# Patient Record
Sex: Male | Born: 1980 | Race: White | Hispanic: No | Marital: Married | Smoking: Former smoker
Health system: Southern US, Community
[De-identification: ages and names within clinical notes are randomized; demographics above are authoritative.]

## PROBLEM LIST (undated history)

## (undated) DIAGNOSIS — K8020 Calculus of gallbladder without cholecystitis without obstruction: Secondary | ICD-10-CM

---

## 2012-11-09 ENCOUNTER — Other Ambulatory Visit (INDEPENDENT_AMBULATORY_CARE_PROVIDER_SITE_OTHER): Payer: BC Managed Care – HMO

## 2012-11-09 DIAGNOSIS — Z111 Encounter for screening for respiratory tuberculosis: Secondary | ICD-10-CM

## 2012-11-11 LAB — TB SKIN TEST
Induration: 0 mm
TB Skin Test: NEGATIVE

## 2012-11-22 ENCOUNTER — Emergency Department (HOSPITAL_COMMUNITY): Payer: BC Managed Care – PPO

## 2012-11-22 ENCOUNTER — Encounter (HOSPITAL_COMMUNITY)
Admission: EM | Disposition: A | Discharge status: Home / Self Care | Payer: Self-pay | Source: Home / Self Care | Attending: Emergency Medicine

## 2012-11-22 ENCOUNTER — Observation Stay (HOSPITAL_COMMUNITY): Payer: BC Managed Care – PPO | Admitting: Certified Registered"

## 2012-11-22 ENCOUNTER — Encounter (HOSPITAL_COMMUNITY): Payer: Self-pay | Admitting: Certified Registered"

## 2012-11-22 ENCOUNTER — Encounter (HOSPITAL_COMMUNITY): Payer: Self-pay | Admitting: *Deleted

## 2012-11-22 ENCOUNTER — Observation Stay (HOSPITAL_COMMUNITY)
Admission: EM | Admit: 2012-11-22 | Discharge: 2012-11-23 | Disposition: A | Discharge status: Home / Self Care | Payer: BC Managed Care – PPO | Attending: General Surgery | Admitting: General Surgery

## 2012-11-22 DIAGNOSIS — K8020 Calculus of gallbladder without cholecystitis without obstruction: Secondary | ICD-10-CM

## 2012-11-22 DIAGNOSIS — K8000 Calculus of gallbladder with acute cholecystitis without obstruction: Principal | ICD-10-CM | POA: Insufficient documentation

## 2012-11-22 DIAGNOSIS — R1011 Right upper quadrant pain: Secondary | ICD-10-CM | POA: Insufficient documentation

## 2012-11-22 DIAGNOSIS — E876 Hypokalemia: Secondary | ICD-10-CM | POA: Diagnosis present

## 2012-11-22 DIAGNOSIS — K8010 Calculus of gallbladder with chronic cholecystitis without obstruction: Secondary | ICD-10-CM

## 2012-11-22 DIAGNOSIS — K8050 Calculus of bile duct without cholangitis or cholecystitis without obstruction: Secondary | ICD-10-CM | POA: Diagnosis present

## 2012-11-22 DIAGNOSIS — R7309 Other abnormal glucose: Secondary | ICD-10-CM | POA: Insufficient documentation

## 2012-11-22 DIAGNOSIS — R739 Hyperglycemia, unspecified: Secondary | ICD-10-CM | POA: Diagnosis present

## 2012-11-22 HISTORY — PX: CHOLECYSTECTOMY: SHX55

## 2012-11-22 HISTORY — DX: Calculus of gallbladder without cholecystitis without obstruction: K80.20

## 2012-11-22 LAB — COMPREHENSIVE METABOLIC PANEL
Alkaline Phosphatase: 80 U/L (ref 39–117)
BUN: 14 mg/dL (ref 6–23)
CO2: 24 mEq/L (ref 19–32)
Chloride: 102 mEq/L (ref 96–112)
GFR calc Af Amer: >90 mL/min (ref >90)
Glucose, Bld: 185 mg/dL — ABNORMAL HIGH (ref 70–99)
Potassium: 3.2 mEq/L — ABNORMAL LOW (ref 3.5–5.1)
Total Bilirubin: 0.6 mg/dL (ref 0.3–1.2)

## 2012-11-22 LAB — URINALYSIS, ROUTINE W REFLEX MICROSCOPIC
Bilirubin Urine: NEGATIVE
Ketones, ur: 40 mg/dL — AB
Nitrite: NEGATIVE
Urobilinogen, UA: 0.2 mg/dL (ref 0.0–1.0)
pH: 6 (ref 5.0–8.0)

## 2012-11-22 LAB — HEMOGLOBIN A1C: Mean Plasma Glucose: 91 mg/dL (ref <117)

## 2012-11-22 LAB — CBC WITH DIFFERENTIAL/PLATELET
Hemoglobin: 16.2 g/dL (ref 13.0–17.0)
Lymphs Abs: 2.4 10*3/uL (ref 0.7–4.0)
Monocytes Relative: 10 % (ref 3–12)
Neutro Abs: 12.4 10*3/uL — ABNORMAL HIGH (ref 1.7–7.7)
Neutrophils Relative %: 74 % (ref 43–77)
RBC: 4.96 MIL/uL (ref 4.22–5.81)

## 2012-11-22 LAB — LIPASE, BLOOD: Lipase: 18 U/L (ref 11–59)

## 2012-11-22 SURGERY — LAPAROSCOPIC CHOLECYSTECTOMY
Anesthesia: General | Site: Abdomen | Wound class: Clean Contaminated

## 2012-11-22 MED ORDER — POTASSIUM CHLORIDE IN NACL 20-0.9 MEQ/L-% IV SOLN
INTRAVENOUS | Status: DC
  Administered 2012-11-22 – 2012-11-23 (×2): via INTRAVENOUS
  Filled 2012-11-22 (×4): qty 1000

## 2012-11-22 MED ORDER — POTASSIUM CHLORIDE IN NACL 20-0.9 MEQ/L-% IV SOLN
INTRAVENOUS | Status: DC
  Filled 2012-11-22 (×3): qty 1000

## 2012-11-22 MED ORDER — MIDAZOLAM HCL 5 MG/5ML IJ SOLN
INTRAMUSCULAR | Status: DC | PRN
  Administered 2012-11-22: 2 mg via INTRAVENOUS

## 2012-11-22 MED ORDER — LIDOCAINE HCL (CARDIAC) 20 MG/ML IV SOLN
INTRAVENOUS | Status: DC | PRN
  Administered 2012-11-22: 100 mg via INTRAVENOUS

## 2012-11-22 MED ORDER — MORPHINE SULFATE 2 MG/ML IJ SOLN
1.0000 mg | INTRAMUSCULAR | Status: DC | PRN
  Administered 2012-11-22 (×3): 2 mg via INTRAVENOUS
  Filled 2012-11-22 (×3): qty 1

## 2012-11-22 MED ORDER — BUPIVACAINE-EPINEPHRINE 0.25% -1:200000 IJ SOLN
INTRAMUSCULAR | Status: DC | PRN
  Administered 2012-11-22 (×2): 30 mL

## 2012-11-22 MED ORDER — DEXAMETHASONE SODIUM PHOSPHATE 4 MG/ML IJ SOLN
INTRAMUSCULAR | Status: DC | PRN
  Administered 2012-11-22: 8 mg via INTRAVENOUS

## 2012-11-22 MED ORDER — NEOSTIGMINE METHYLSULFATE 1 MG/ML IJ SOLN
INTRAMUSCULAR | Status: DC | PRN
  Administered 2012-11-22: 3 mg via INTRAVENOUS

## 2012-11-22 MED ORDER — SODIUM CHLORIDE 0.9 % IJ SOLN
INTRAMUSCULAR | Status: DC | PRN
  Administered 2012-11-22: 14:00:00

## 2012-11-22 MED ORDER — DIPHENHYDRAMINE HCL 50 MG/ML IJ SOLN: 12.5000 mg | Freq: Four times a day (QID) | INTRAMUSCULAR | Status: DC | PRN

## 2012-11-22 MED ORDER — LACTATED RINGERS IV SOLN
INTRAVENOUS | Status: DC | PRN
  Administered 2012-11-22 (×2): via INTRAVENOUS

## 2012-11-22 MED ORDER — DIPHENHYDRAMINE HCL 12.5 MG/5ML PO ELIX: 12.5000 mg | ORAL_SOLUTION | Freq: Four times a day (QID) | ORAL | Status: DC | PRN

## 2012-11-22 MED ORDER — OXYCODONE HCL 5 MG/5ML PO SOLN: 5.0000 mg | Freq: Once | ORAL | Status: AC | PRN

## 2012-11-22 MED ORDER — LACTATED RINGERS IV SOLN
INTRAVENOUS | Status: DC
  Administered 2012-11-22: 13:00:00 via INTRAVENOUS

## 2012-11-22 MED ORDER — HYDROMORPHONE HCL PF 1 MG/ML IJ SOLN
0.2500 mg | INTRAMUSCULAR | Status: DC | PRN
  Administered 2012-11-22 (×3): 0.5 mg via INTRAVENOUS

## 2012-11-22 MED ORDER — ROCURONIUM BROMIDE 100 MG/10ML IV SOLN
INTRAVENOUS | Status: DC | PRN
  Administered 2012-11-22: 10 mg via INTRAVENOUS
  Administered 2012-11-22: 40 mg via INTRAVENOUS

## 2012-11-22 MED ORDER — CIPROFLOXACIN IN D5W 400 MG/200ML IV SOLN
400.0000 mg | Freq: Two times a day (BID) | INTRAVENOUS | Status: DC
  Administered 2012-11-22: 400 mg via INTRAVENOUS
  Filled 2012-11-22 (×2): qty 200

## 2012-11-22 MED ORDER — FENTANYL CITRATE 0.05 MG/ML IJ SOLN
INTRAMUSCULAR | Status: DC | PRN
  Administered 2012-11-22: 100 ug via INTRAVENOUS
  Administered 2012-11-22: 50 ug via INTRAVENOUS
  Administered 2012-11-22: 100 ug via INTRAVENOUS

## 2012-11-22 MED ORDER — HYDROMORPHONE HCL PF 1 MG/ML IJ SOLN
INTRAMUSCULAR | Status: DC | PRN
  Administered 2012-11-22: 1 mg via INTRAVENOUS

## 2012-11-22 MED ORDER — HYDROMORPHONE HCL PF 1 MG/ML IJ SOLN
1.0000 mg | Freq: Once | INTRAMUSCULAR | Status: AC
  Administered 2012-11-22: 1 mg via INTRAVENOUS
  Filled 2012-11-22: qty 1

## 2012-11-22 MED ORDER — OXYCODONE-ACETAMINOPHEN 5-325 MG PO TABS
1.0000 | ORAL_TABLET | ORAL | Status: DC | PRN
  Administered 2012-11-22 – 2012-11-23 (×3): 2 via ORAL
  Filled 2012-11-22 (×3): qty 2

## 2012-11-22 MED ORDER — ONDANSETRON HCL 4 MG/2ML IJ SOLN: 4.0000 mg | Freq: Four times a day (QID) | INTRAMUSCULAR | Status: DC | PRN

## 2012-11-22 MED ORDER — ONDANSETRON HCL 4 MG/2ML IJ SOLN
INTRAMUSCULAR | Status: DC | PRN
  Administered 2012-11-22: 4 mg via INTRAVENOUS

## 2012-11-22 MED ORDER — PROPOFOL 10 MG/ML IV BOLUS
INTRAVENOUS | Status: DC | PRN
  Administered 2012-11-22: 200 mg via INTRAVENOUS

## 2012-11-22 MED ORDER — 0.9 % SODIUM CHLORIDE (POUR BTL) OPTIME
TOPICAL | Status: DC | PRN
  Administered 2012-11-22: 1000 mL

## 2012-11-22 MED ORDER — ONDANSETRON HCL 4 MG/2ML IJ SOLN
4.0000 mg | Freq: Once | INTRAMUSCULAR | Status: AC
  Administered 2012-11-22: 4 mg via INTRAVENOUS
  Filled 2012-11-22: qty 2

## 2012-11-22 MED ORDER — GLYCOPYRROLATE 0.2 MG/ML IJ SOLN
INTRAMUSCULAR | Status: DC | PRN
  Administered 2012-11-22: 0.4 mg via INTRAVENOUS

## 2012-11-22 MED ORDER — SUCCINYLCHOLINE CHLORIDE 20 MG/ML IJ SOLN
INTRAMUSCULAR | Status: DC | PRN
  Administered 2012-11-22: 100 mg via INTRAVENOUS

## 2012-11-22 MED ORDER — SODIUM CHLORIDE 0.9 % IR SOLN
Status: DC | PRN
  Administered 2012-11-22: 1000 mL

## 2012-11-22 MED ORDER — OXYCODONE HCL 5 MG PO TABS
5.0000 mg | ORAL_TABLET | Freq: Once | ORAL | Status: AC | PRN
Start: 2012-11-22 — End: 2012-11-22

## 2012-11-22 MED ORDER — CIPROFLOXACIN IN D5W 400 MG/200ML IV SOLN
400.0000 mg | Freq: Two times a day (BID) | INTRAVENOUS | Status: AC
  Administered 2012-11-22 – 2012-11-23 (×2): 400 mg via INTRAVENOUS
  Filled 2012-11-22 (×2): qty 200

## 2012-11-22 SURGICAL SUPPLY — 36 items
APPLIER CLIP 5 13 M/L LIGAMAX5 (MISCELLANEOUS) ×3
BLADE SURG ROTATE 9660 (MISCELLANEOUS) IMPLANT
CANISTER SUCTION 2500CC (MISCELLANEOUS) ×3 IMPLANT
CHLORAPREP W/TINT 26ML (MISCELLANEOUS) ×3 IMPLANT
CLIP APPLIE 5 13 M/L LIGAMAX5 (MISCELLANEOUS) ×2 IMPLANT
CLOTH BEACON ORANGE TIMEOUT ST (SAFETY) ×3 IMPLANT
COVER MAYO STAND STRL (DRAPES) ×3 IMPLANT
COVER SURGICAL LIGHT HANDLE (MISCELLANEOUS) ×3 IMPLANT
DECANTER SPIKE VIAL GLASS SM (MISCELLANEOUS) ×6 IMPLANT
DERMABOND ADHESIVE PROPEN (GAUZE/BANDAGES/DRESSINGS) ×1
DERMABOND ADVANCED (GAUZE/BANDAGES/DRESSINGS) ×1
DERMABOND ADVANCED .7 DNX12 (GAUZE/BANDAGES/DRESSINGS) ×2 IMPLANT
DERMABOND ADVANCED .7 DNX6 (GAUZE/BANDAGES/DRESSINGS) ×2 IMPLANT
DRAPE C-ARM 42X72 X-RAY (DRAPES) ×3 IMPLANT
ELECT REM PT RETURN 9FT ADLT (ELECTROSURGICAL) ×3
ELECTRODE REM PT RTRN 9FT ADLT (ELECTROSURGICAL) ×2 IMPLANT
GLOVE BIO SURGEON STRL SZ7 (GLOVE) ×3 IMPLANT
GLOVE BIOGEL PI IND STRL 7.5 (GLOVE) ×2 IMPLANT
GLOVE BIOGEL PI INDICATOR 7.5 (GLOVE) ×1
GOWN STRL NON-REIN LRG LVL3 (GOWN DISPOSABLE) ×12 IMPLANT
KIT BASIN OR (CUSTOM PROCEDURE TRAY) ×3 IMPLANT
KIT ROOM TURNOVER OR (KITS) ×3 IMPLANT
NS IRRIG 1000ML POUR BTL (IV SOLUTION) ×3 IMPLANT
PAD ARMBOARD 7.5X6 YLW CONV (MISCELLANEOUS) ×3 IMPLANT
POUCH SPECIMEN RETRIEVAL 10MM (ENDOMECHANICALS) ×3 IMPLANT
SCISSORS LAP 5X35 DISP (ENDOMECHANICALS) IMPLANT
SET CHOLANGIOGRAPH 5 50 .035 (SET/KITS/TRAYS/PACK) IMPLANT
SET IRRIG TUBING LAPAROSCOPIC (IRRIGATION / IRRIGATOR) ×3 IMPLANT
SLEEVE ENDOPATH XCEL 5M (ENDOMECHANICALS) ×6 IMPLANT
SPECIMEN JAR SMALL (MISCELLANEOUS) ×3 IMPLANT
SUT MNCRL AB 4-0 PS2 18 (SUTURE) ×3 IMPLANT
TOWEL OR 17X24 6PK STRL BLUE (TOWEL DISPOSABLE) ×3 IMPLANT
TOWEL OR 17X26 10 PK STRL BLUE (TOWEL DISPOSABLE) ×3 IMPLANT
TRAY LAPAROSCOPIC (CUSTOM PROCEDURE TRAY) ×3 IMPLANT
TROCAR XCEL BLUNT TIP 100MML (ENDOMECHANICALS) ×3 IMPLANT
TROCAR XCEL NON-BLD 5MMX100MML (ENDOMECHANICALS) ×3 IMPLANT

## 2012-11-22 NOTE — Transfer of Care (Signed)
Immediate Anesthesia Transfer of Care Note  Patient: Kenneth Munoz  Procedure(s) Performed: Procedure(s): LAPAROSCOPIC CHOLECYSTECTOMY  Patient Location: PACU  Anesthesia Type:General  Level of Consciousness: awake, alert , oriented and patient cooperative  Airway & Oxygen Therapy: Patient Spontanous Breathing and Patient connected to nasal cannula oxygen  Post-op Assessment: Report given to PACU RN, Post -op Vital signs reviewed and stable and Patient moving all extremities  Post vital signs: Reviewed and stable  Complications: No apparent anesthesia complications

## 2012-11-22 NOTE — ED Notes (Signed)
The pt is c/i epigastric  Pain  With nv .  The pain goes through to his posterior chest.  He has known gallstones and this feels, like the same

## 2012-11-22 NOTE — Op Note (Addendum)
Preoperative diagnosis: Acute cholecystitis Postoperative diagnosis: Same as above Procedure: Laparoscopic cholecystectomy Surgeon: Dr Harden Mo Asst: Dr Gaynelle Adu Anesthesia: Gen. Drains: None Specimens: Gallbladder and contents to pathology Estimated blood loss: Minimal Complications: None Sponge needle count correct at end of operation Disposition to recovery stable  Indications: This a 32 year old male with a known history of gallstones and prior symptoms who presents with recent onset of right upper quadrant and epigastric pain that is not going away. His liver function tests and lipase are normal. His white blood cell count is elevated. He has another ultrasound consistent with cholelithiasis. We discussed proceeding with a laparoscopic cholecystectomy.  Procedure: After informed consent was obtained the patient was taken to the operating room. He was given ciprofloxacin due to his penicillin allergy. Sequential compression devices were on his legs. He was placed under general anesthesia without complication. His abdomen was prepped and draped in the standard sterile surgical fashion. Surgical timeout was performed.  I infiltrated Marcaine below his umbilicus. I made a vertical incision. I entered his fascia sharply and the peritoneum bluntly. I then placed a 0 Vicryl pursestring suture. I placed a Hassan trocar insufflated the abdomen to 15 mm mercury pressure. 3 further 5 mm trocars were then placed in the epigastrium and right side of the abdomen under direct vision without complication. His gallbladder was then noted to be distended. This was decompressed. The gallbladder was then retracted cephalad and lateral. With some dissection the critical view was eventually obtained. There were 2 branches of the artery and the posterior branch had a mild amount of bleeding associated with it. I clipped the duct and divided it. I then clipped the anterior branch of the artery and divided it.  I placed clips on the posterior branch and this made his hemostatic. The clip on the gallbladder did fall off and a couple of small stones that fell out I evacuated. The gallbladder was then removed from the liver bed. It was then placed in an Endo Catch bag and removed from the umbilicus. Irrigation was performed. This was clear. Hemostasis was observed. I did not see any remaining stones. I then removed the umbilical trocar and tied my pursestring. This completely liberated obliterated the defect. I then removed my remaining trocars. I closed these with 4 Monocryl and Dermabond. He tolerated this well was extubated and transferred to recovery stable.

## 2012-11-22 NOTE — ED Notes (Signed)
Pt has come to pod c to await surgical evaluation

## 2012-11-22 NOTE — ED Provider Notes (Signed)
History    CSN: [id number] Arrival date & time 11/22/12  0250  First MD Initiated Contact with Patient 11/22/12 0257     Chief Complaint  Patient presents with  . Abdominal Pain   (Consider location/radiation/quality/duration/timing/severity/associated sxs/prior Treatment) HPI Comments: Patient presents to ER for evaluation of abdominal pain. Patient reports severe pain in the center of his upper abdomen which radiates into his back. Patient reports that he had a similar episode about a year ago and was was his gallbladder. He never had followup for surgery. This is the first time he has had the pain again since the initial attack.  Patient is a 32 y.o. male presenting with abdominal pain.  Abdominal Pain Associated symptoms include abdominal pain.   Past Medical History  Diagnosis Date  . Gallstones    History reviewed. No pertinent past surgical history. No family history on file. History  Substance Use Topics  . Smoking status: Current Every Day Smoker  . Smokeless tobacco: Not on file  . Alcohol Use: Yes    Review of Systems  Gastrointestinal: Positive for abdominal pain.  Musculoskeletal: Positive for back pain.  All other systems reviewed and are negative.    Allergies  Review of patient's allergies indicates not on file.  Home Medications  No current outpatient prescriptions on file. BP 118/71  Pulse 80  Resp 20  SpO2 99% Physical Exam  Constitutional: He is oriented to person, place, and time. He appears well-developed and well-nourished. No distress.  HENT:  Head: Normocephalic and atraumatic.  Right Ear: Hearing normal.  Left Ear: Hearing normal.  Nose: Nose normal.  Mouth/Throat: Oropharynx is clear and moist and mucous membranes are normal.  Eyes: Conjunctivae and EOM are normal. Pupils are equal, round, and reactive to light.  Neck: Normal range of motion. Neck supple.  Cardiovascular: Regular rhythm, S1 normal and S2 normal.  Exam reveals no  gallop and no friction rub.   No murmur heard. Pulmonary/Chest: Effort normal and breath sounds normal. No respiratory distress. He exhibits no tenderness.  Abdominal: Soft. Normal appearance and bowel sounds are normal. There is no hepatosplenomegaly. There is tenderness in the epigastric area. There is no rebound, no guarding, no tenderness at McBurney's point and negative Murphy's sign. No hernia.  Musculoskeletal: Normal range of motion.  Neurological: He is alert and oriented to person, place, and time. He has normal strength. No cranial nerve deficit or sensory deficit. Coordination normal. GCS eye subscore is 4. GCS verbal subscore is 5. GCS motor subscore is 6.  Skin: Skin is warm, dry and intact. No rash noted. No cyanosis.  Psychiatric: He has a normal mood and affect. His speech is normal and behavior is normal. Thought content normal.    ED Course  Procedures (including critical care time) Labs Reviewed  CBC WITH DIFFERENTIAL - Abnormal; Notable for the following:    WBC 16.9 (*)    MCHC 37.0 (*)    Neutro Abs 12.4 (*)    Monocytes Absolute 1.7 (*)    All other components within normal limits  COMPREHENSIVE METABOLIC PANEL - Abnormal; Notable for the following:    Potassium 3.2 (*)    Glucose, Bld 185 (*)    All other components within normal limits  URINALYSIS, ROUTINE W REFLEX MICROSCOPIC - Abnormal; Notable for the following:    Color, Urine AMBER (*)    Glucose, UA 250 (*)    Ketones, ur 40 (*)    All other components within normal limits  LIPASE, BLOOD   US Abdomen Complete  11/22/2012   *RADIOLOGY REPORT*  Clinical Data:  Nominal pain and elevated white cell count. History of gallstones.  COMPLETE ABDOMINAL ULTRASOUND  Comparison:  None.  Findings:  Gallbladder:  Cholelithiasis with multiple stones in the dependent gallbladder.  Murphy's sign is negative.  No gallbladder wall thickening or pericholecystic edema.  Common bile duct:  Normal caliber with measured diameter  is 6.5 mm.  Liver:  No focal lesion identified.  Within normal limits in parenchymal echogenicity. Limited color flow Doppler imaging demonstrates appropriate flow direction in the main portal vein.  IVC:  Appears normal.  Pancreas:  Not visualized due to overlying bowel gas.  Spleen:  Spleen length measures 12.9 cm.  Normal parenchymal echotexture.  Right Kidney:  Right kidney measures 11.2 cm length.  No hydronephrosis.  Left Kidney:  Left kidney measures 12.2 cm length.  No hydronephrosis.  Abdominal aorta:  No aneurysm identified.  IMPRESSION: Cholelithiasis.  No specific signs of cholecystitis.   Original Report Authenticated By: Burman Nieves, M.D.   Diagnosis: 1. Biliary colic 2. Cholelithiasis without cholecystitis  MDM  Patient presents to the ER for evaluation of upper abdominal pain. Patient is in severe pain at arrival to the ER. He reports that he has been told that he has "gallbladder problems" in the past. He wasn't sure if it was gallstones. Patient has not had any problems with this type of pain since the first episode. Patient had small improvement with a dose of Dilaudid. Ultrasound showed gallstones without evidence of cholecystitis. Patient had a repeat dose of Dilaudid with further improvement, but is still not pain-free. General surgery therefore consulted. Will see patient in ER.  Gilda Crease, MD 11/22/12 [phone]

## 2012-11-22 NOTE — H&P (Signed)
Agree with above, I discussed the procedure in detail.  We discussed the risks and benefits of a laparoscopic cholecystectomy and possible cholangiogram including, but not limited to bleeding, infection, injury to surrounding structures such as the intestine or liver, bile leak, retained gallstones, need to convert to an open procedure, prolonged diarrhea, blood clots such as  DVT, common bile duct injury, anesthesia risks, and possible need for additional procedures.  The likelihood of improvement in symptoms and return to the patient's normal status is good. We discussed the typical post-operative recovery course.  

## 2012-11-22 NOTE — H&P (Signed)
Kenneth Munoz 1980/12/20  [id number].   Primary Care MD: Dr. Almedia Balls Chief Complaint/Reason for Consult: gallstones HPI: This is a pleasant 32 yo male who no PMH who was diagnosed with gallstones a year and a half ago after a 2 hour attack of epigastric abdominal pain.  He deferred surgical follow up as he was having no other symptoms.  However, last night he awoke at 1200am with severe epigastric abdominal pain radiating to his back.  He had nausea and multiple episodes of emesis.  Denies diarrhea.  No fevers or chills. He presented to [facility] where he had an [place] that revealed gallstones, but no wall thickening or pericholecystic fluid.  His WBC was 17K and LFTs were normal.  We have been asked to see for surgical evaluation. (of note his CBG is in the 180s and urine glucose in the mid 200s)  Review of Systems: Please see HPI, otherwise all other systems are negative.  History reviewed. No pertinent family history.  Past Medical History  Diagnosis Date  . Gallstones     History reviewed. No pertinent past surgical history.  Social History:  reports that he has been smoking.  He does not have any smokeless tobacco history on file. He reports that  drinks alcohol. His drug history is not on file.  Allergies:  Allergies  Allergen Reactions  . Penicillins     Childhood allergy reaction unknown     (Not in a hospital admission)  Blood pressure 119/67, pulse 63, resp. rate 20, SpO2 96.00%. Physical Exam: General: pleasant, WD, WN white male who is laying in bed in NAD HEENT: head is normocephalic, atraumatic.  Sclera are noninjected.  PERRL.  Ears and nose without any masses or lesions.  Mouth is pink and moist Heart: regular, rate, and rhythm.  Normal s1,s2. No obvious murmurs, gallops, or rubs noted.  Palpable radial and pedal pulses bilaterally Lungs: CTAB, no wheezes, rhonchi, or rales noted.  Respiratory effort nonlabored Abd: soft, tender in the epigastrium and RUQ, ND, +BS, no  masses, hernias, or organomegaly MS: all 4 extremities are symmetrical with no cyanosis, clubbing, or edema. Skin: warm and dry with no masses, lesions, or rashes Psych: A&Ox3 with an appropriate affect.    Results for orders placed during the hospital encounter of 11/22/12 (from the past 48 hour(s))  CBC WITH DIFFERENTIAL     Status: Abnormal   Collection Time    11/22/12  3:40 AM      Result Value Range   WBC 16.9 (*) 4.0 - 10.5 K/uL   RBC 4.96  4.22 - 5.81 MIL/uL   Hemoglobin 16.2  13.0 - 17.0 g/dL   HCT 40.9  81.1 - 91.4 %   MCV 88.3  78.0 - 100.0 fL   MCH 32.7  26.0 - 34.0 pg   MCHC 37.0 (*) 30.0 - 36.0 g/dL   RDW 78.2  95.6 - 21.3 %   Platelets 234  150 - 400 K/uL   Neutrophils Relative % 74  43 - 77 %   Neutro Abs 12.4 (*) 1.7 - 7.7 K/uL   Lymphocytes Relative 14  12 - 46 %   Lymphs Abs 2.4  0.7 - 4.0 K/uL   Monocytes Relative 10  3 - 12 %   Monocytes Absolute 1.7 (*) 0.1 - 1.0 K/uL   Eosinophils Relative 2  0 - 5 %   Eosinophils Absolute 0.4  0.0 - 0.7 K/uL   Basophils Relative 0  0 - 1 %  Basophils Absolute 0.1  0.0 - 0.1 K/uL  COMPREHENSIVE METABOLIC PANEL     Status: Abnormal   Collection Time    11/22/12  3:40 AM      Result Value Range   Sodium 138  135 - 145 mEq/L   Potassium 3.2 (*) 3.5 - 5.1 mEq/L   Chloride 102  96 - 112 mEq/L   CO2 24  19 - 32 mEq/L   Glucose, Bld 185 (*) 70 - 99 mg/dL   BUN 14  6 - 23 mg/dL   Creatinine, Ser 1.61  0.50 - 1.35 mg/dL   Calcium 9.1  8.4 - 09.6 mg/dL   Total Protein 7.1  6.0 - 8.3 g/dL   Albumin 4.2  3.5 - 5.2 g/dL   AST 16  0 - 37 U/L   ALT 16  0 - 53 U/L   Alkaline Phosphatase 80  39 - 117 U/L   Total Bilirubin 0.6  0.3 - 1.2 mg/dL   GFR calc non Af Amer >90  >90 mL/min   GFR calc Af Amer >90  >90 mL/min   Comment:            The eGFR has been calculated     using the CKD EPI equation.     This calculation has not been     validated in all clinical     situations.     eGFR's persistently     <90 mL/min  signify     possible Chronic Kidney Disease.  LIPASE, BLOOD     Status: None   Collection Time    11/22/12  3:40 AM      Result Value Range   Lipase 18  11 - 59 U/L  URINALYSIS, ROUTINE W REFLEX MICROSCOPIC     Status: Abnormal   Collection Time    11/22/12  4:55 AM      Result Value Range   Color, Urine AMBER (*) YELLOW   Comment: BIOCHEMICALS MAY BE AFFECTED BY COLOR   APPearance CLEAR  CLEAR   Specific Gravity, Urine 1.028  1.005 - 1.030   pH 6.0  5.0 - 8.0   Glucose, UA 250 (*) NEGATIVE mg/dL   Hgb urine dipstick NEGATIVE  NEGATIVE   Bilirubin Urine NEGATIVE  NEGATIVE   Ketones, ur 40 (*) NEGATIVE mg/dL   Protein, ur NEGATIVE  NEGATIVE mg/dL   Urobilinogen, UA 0.2  0.0 - 1.0 mg/dL   Nitrite NEGATIVE  NEGATIVE   Leukocytes, UA NEGATIVE  NEGATIVE   Comment: MICROSCOPIC NOT DONE ON URINES WITH NEGATIVE PROTEIN, BLOOD, LEUKOCYTES, NITRITE, OR GLUCOSE <1000 mg/dL.   US Abdomen Complete  11/22/2012   *RADIOLOGY REPORT*  Clinical Data:  Nominal pain and elevated white cell count. History of gallstones.  COMPLETE ABDOMINAL ULTRASOUND  Comparison:  None.  Findings:  Gallbladder:  Cholelithiasis with multiple stones in the dependent gallbladder.  Murphy's sign is negative.  No gallbladder wall thickening or pericholecystic edema.  Common bile duct:  Normal caliber with measured diameter is 6.5 mm.  Liver:  No focal lesion identified.  Within normal limits in parenchymal echogenicity. Limited color flow Doppler imaging demonstrates appropriate flow direction in the main portal vein.  IVC:  Appears normal.  Pancreas:  Not visualized due to overlying bowel gas.  Spleen:  Spleen length measures 12.9 cm.  Normal parenchymal echotexture.  Right Kidney:  Right kidney measures 11.2 cm length.  No hydronephrosis.  Left Kidney:  Left kidney measures 12.2 cm length.  No hydronephrosis.  Abdominal aorta:  No aneurysm identified.  IMPRESSION: Cholelithiasis.  No specific signs of cholecystitis.   Original  Report Authenticated By: Burman Nieves, M.D.       Assessment/Plan 1. Biliary Colic, possibly early cholecystitis 2. hyperglycemia 3. Hypokalemia  Plan: 1. We will admit this patient and plan for lap chole later today.  We will start him on Cipro since he has a PCN allergy.  The procedure along with risks, complications, and outcome have been discussed with he and his girlfriend.  They understand and are agreeable to proceed.  We will replace his K with his IVFs.  I will check a hgbA1C as well given the elevated CBG and glucose in his urine.  Kenneth Munoz E 11/22/2012, 8:41 AM Pager: [phone]

## 2012-11-22 NOTE — Preoperative (Signed)
Beta Blockers   Reason not to administer Beta Blockers:Not Applicable 

## 2012-11-22 NOTE — Anesthesia Postprocedure Evaluation (Signed)
Anesthesia Post Note  Patient: Kenneth Munoz  Procedure(s) Performed: Procedure(s): LAPAROSCOPIC CHOLECYSTECTOMY  Anesthesia type: General  Patient location: PACU  Post pain: Pain level controlled  Post assessment: Patient's Cardiovascular Status Stable  Last Vitals:  Filed Vitals:   11/22/12 1552  BP: 112/62  Pulse: 75  Temp: 36.7 C  Resp: 16    Post vital signs: Reviewed and stable  Level of consciousness: alert  Complications: No apparent anesthesia complications

## 2012-11-22 NOTE — ED Notes (Signed)
Surgical pa here to begin eval

## 2012-11-22 NOTE — ED Notes (Signed)
Patient transported to Ultrasound 

## 2012-11-22 NOTE — Anesthesia Preprocedure Evaluation (Signed)
Anesthesia Evaluation  Patient identified by MRN, date of birth, ID band Patient awake    Reviewed: Allergy & Precautions, H&P , NPO status , Patient's Chart, lab work & pertinent test results  Airway Mallampati: I TM Distance: >3 FB Neck ROM: Full    Dental no notable dental hx. (+) Teeth Intact and Dental Advisory Given   Pulmonary neg pulmonary ROS,  breath sounds clear to auscultation  Pulmonary exam normal       Cardiovascular negative cardio ROS  Rhythm:Regular Rate:Normal     Neuro/Psych negative neurological ROS  negative psych ROS   GI/Hepatic negative GI ROS, Neg liver ROS,   Endo/Other  negative endocrine ROS  Renal/GU negative Renal ROS  negative genitourinary   Musculoskeletal   Abdominal   Peds  Hematology negative hematology ROS (+)   Anesthesia Other Findings   Reproductive/Obstetrics negative OB ROS                           Anesthesia Physical Anesthesia Plan  ASA: I  Anesthesia Plan: General   Post-op Pain Management:    Induction: Intravenous  Airway Management Planned: Oral ETT  Additional Equipment:   Intra-op Plan:   Post-operative Plan: Extubation in OR  Informed Consent: I have reviewed the patients History and Physical, chart, labs and discussed the procedure including the risks, benefits and alternatives for the proposed anesthesia with the patient or authorized representative who has indicated his/her understanding and acceptance.   Dental advisory given  Plan Discussed with: CRNA  Anesthesia Plan Comments:         Anesthesia Quick Evaluation  

## 2012-11-22 NOTE — ED Notes (Signed)
To bathroom unable to void now.

## 2012-11-22 NOTE — ED Notes (Signed)
Report called to [place] on [address]

## 2012-11-23 ENCOUNTER — Encounter (HOSPITAL_COMMUNITY): Payer: Self-pay | Admitting: General Surgery

## 2012-11-23 MED ORDER — OXYCODONE-ACETAMINOPHEN 5-325 MG PO TABS: 1.0000 | ORAL_TABLET | ORAL | Status: DC | PRN

## 2012-11-23 NOTE — Progress Notes (Signed)
1 Day Post-Op  Subjective: Pt doing okay, pain under adequate control with oral medication.  He has been up.  Tolerated clear liquid diet last night.  No flatus or bm.  Voiding without any difficulties.  Objective: Vital signs in last 24 hours: Temp:  [98 F (36.7 C)-98.6 F (37 C)] 98.6 F (37 C) (07/08 0612) Pulse Rate:  [56-87] 56 (07/08 0612) Resp:  [13-34] 18 (07/08 0612) BP: (103-131)/(47-70) 115/60 mmHg (07/08 0612) SpO2:  [95 %-98 %] 98 % (07/08 0612)    Intake/Output from previous day: 07/07 0701 - 07/08 0700 In: 1890 [I.V.:1890] Out: 1115 [Urine:1100; Blood:15] Intake/Output this shift:    General appearance: alert, cooperative, appears stated age and no distress Resp: clear to auscultation bilaterally Cardio: regular rate and rhythm, S1, S2 normal, no murmur, click, rub or gallop GI: +BS x4 quadrants, mild tenderness only around the incision sites.  Incision sites are clean dry and intact without erythema or drainage.  No hernias, masses or organomegaly. Extremities: extremities normal, atraumatic, no cyanosis or edema  Lab Results:   Recent Labs  11/22/12 0340  WBC 16.9*  HGB 16.2  HCT 43.8  PLT 234   BMET  Recent Labs  11/22/12 0340  NA 138  K 3.2*  CL 102  CO2 24  GLUCOSE 185*  BUN 14  CREATININE 0.87  CALCIUM 9.1   Studies/Results: US Abdomen Complete  11/22/2012   *RADIOLOGY REPORT*  Clinical Data:  Nominal pain and elevated white cell count. History of gallstones.  COMPLETE ABDOMINAL ULTRASOUND  Comparison:  None.  Findings:  Gallbladder:  Cholelithiasis with multiple stones in the dependent gallbladder.  Murphy's sign is negative.  No gallbladder wall thickening or pericholecystic edema.  Common bile duct:  Normal caliber with measured diameter is 6.5 mm.  Liver:  No focal lesion identified.  Within normal limits in parenchymal echogenicity. Limited color flow Doppler imaging demonstrates appropriate flow direction in the main portal vein.  IVC:   Appears normal.  Pancreas:  Not visualized due to overlying bowel gas.  Spleen:  Spleen length measures 12.9 cm.  Normal parenchymal echotexture.  Right Kidney:  Right kidney measures 11.2 cm length.  No hydronephrosis.  Left Kidney:  Left kidney measures 12.2 cm length.  No hydronephrosis.  Abdominal aorta:  No aneurysm identified.  IMPRESSION: Cholelithiasis.  No specific signs of cholecystitis.   Original Report Authenticated By: Burman Nieves, M.D.    Anti-infectives: Anti-infectives   Start     Dose/Rate Route Frequency Ordered Stop   11/22/12 2100  ciprofloxacin (CIPRO) IVPB 400 mg     400 mg 200 mL/hr over 60 Minutes Intravenous Every 12 hours 11/22/12 1606 11/23/12 2059   11/22/12 0915  ciprofloxacin (CIPRO) IVPB 400 mg  Status:  Discontinued     400 mg 200 mL/hr over 60 Minutes Intravenous Every 12 hours 11/22/12 0910 11/22/12 1606      Assessment/Plan: Acute Cholecystitis -s/p Lap Chole (Dr. Dwain Sarna 11/22/12) -advance to regular diet -obtain serum potassium(last 3.2 pt not supplemented) -ambulate -pain adequately controlled with oral medication. -may be discharged after breakfast and pending labwork.   LOS: 1 day    Ashok Norris ANP-BC Pager [phone]  11/23/2012 8:04 AM

## 2012-11-23 NOTE — Discharge Summary (Signed)
Physician Discharge Summary  Patient ID: Kenneth Munoz MRN: [id number] DOB/AGE: 1980/06/21 32 y.o.  Admit date: 11/22/2012 Discharge date: 11/23/2012  Admitting Diagnosis: Acute cholecystitis  Hypokalemia  Discharge Diagnosis Patient Active Problem List   Diagnosis Date Noted  . Biliary colic 11/22/2012  . Hyperglycemia 11/22/2012  . Hypokalemia 11/22/2012    Consultants none  Imaging: US Abdomen Complete  11/22/2012   *RADIOLOGY REPORT*  Clinical Data:  Nominal pain and elevated white cell count. History of gallstones.  COMPLETE ABDOMINAL ULTRASOUND  Comparison:  None.  Findings:  Gallbladder:  Cholelithiasis with multiple stones in the dependent gallbladder.  Murphy's sign is negative.  No gallbladder wall thickening or pericholecystic edema.  Common bile duct:  Normal caliber with measured diameter is 6.5 mm.  Liver:  No focal lesion identified.  Within normal limits in parenchymal echogenicity. Limited color flow Doppler imaging demonstrates appropriate flow direction in the main portal vein.  IVC:  Appears normal.  Pancreas:  Not visualized due to overlying bowel gas.  Spleen:  Spleen length measures 12.9 cm.  Normal parenchymal echotexture.  Right Kidney:  Right kidney measures 11.2 cm length.  No hydronephrosis.  Left Kidney:  Left kidney measures 12.2 cm length.  No hydronephrosis.  Abdominal aorta:  No aneurysm identified.  IMPRESSION: Cholelithiasis.  No specific signs of cholecystitis.   Original Report Authenticated By: Burman Nieves, M.D.    Procedures Laparoscopic cholecystectomy  Hospital Course:  Kenneth Munoz is a healthy 32 year old male who presented to [facility] with abdominal pain.  Workup showed early acute cholecystitis.  Patient was admitted and underwent procedure listed above.  Tolerated procedure well and was transferred to the floor.  Diet was advanced as tolerated.  On POD #1 the patient was voiding well, tolerating diet, ambulating well, pain well controlled,  vital signs stable, incisions c/d/i and felt stable for discharge home. He had an A1C which was 4.8% due to hyperglycemia, likely due to acute illness.  He also had hypokalemia, he was asymptomatic and when rechecked it was normal.  Patient will follow up in our office in 2 weeks and knows to call with questions or concerns.    Medication List         oxyCODONE-acetaminophen 5-325 MG per tablet  Commonly known as:  PERCOCET/ROXICET  Take 1-2 tablets by mouth every 4 (four) hours as needed.             Follow-up Information   Follow up with [facility] On 12/14/2012. (APPOINTMENT TIME: 10:45.  PLEASE ARRIVE BEFORE YOUR APPOINTMENT TIME)    Contact information:   [address]   [place] [address] [phone]       Signed: Ashok Norris, [facility] [phone]  11/23/2012, 12:31 PM

## 2012-11-23 NOTE — Progress Notes (Signed)
Agree with above, another glucose was nl, will recheck this am, I suspect this may be due to illness but should follow up with pcp, home if does well this am

## 2012-12-14 ENCOUNTER — Encounter (INDEPENDENT_AMBULATORY_CARE_PROVIDER_SITE_OTHER): Payer: Self-pay | Admitting: Internal Medicine

## 2012-12-14 ENCOUNTER — Ambulatory Visit (INDEPENDENT_AMBULATORY_CARE_PROVIDER_SITE_OTHER): Payer: BC Managed Care – PPO | Admitting: Internal Medicine

## 2012-12-14 VITALS — BP 112/76 | HR 74 | Temp 98.4°F | Resp 16 | Ht 70.0 in | Wt 163.8 lb

## 2012-12-14 DIAGNOSIS — K810 Acute cholecystitis: Secondary | ICD-10-CM

## 2012-12-14 NOTE — Progress Notes (Signed)
  Subjective: Pt returns to the clinic today after undergoing laparoscopic cholecystectomy on 11/22/12 by Dr. Dwain Sarna.  The patient is tolerating their diet well and is having no severe pain.  Bowel function is good.  No problems with the wounds.  Objective: Vital signs in last 24 hours: Reviewed  PE: Abd: soft, non-tender, +bs, incisions well healed  Lab Results:  No results found for this basename: WBC, HGB, HCT, PLT,  in the last 72 hours BMET No results found for this basename: NA, K, CL, CO2, GLUCOSE, BUN, CREATININE, CALCIUM,  in the last 72 hours PT/INR No results found for this basename: LABPROT, INR,  in the last 72 hours CMP     Component Value Date/Time   NA 138 11/22/2012 0340   K 4.4 11/23/2012 0805   CL 102 11/22/2012 0340   CO2 24 11/22/2012 0340   GLUCOSE 185* 11/22/2012 0340   BUN 14 11/22/2012 0340   CREATININE 0.87 11/22/2012 0340   CALCIUM 9.1 11/22/2012 0340   PROT 7.1 11/22/2012 0340   ALBUMIN 4.2 11/22/2012 0340   AST 16 11/22/2012 0340   ALT 16 11/22/2012 0340   ALKPHOS 80 11/22/2012 0340   BILITOT 0.6 11/22/2012 0340   GFRNONAA >90 11/22/2012 0340   GFRAA >90 11/22/2012 0340   Lipase     Component Value Date/Time   LIPASE 18 11/22/2012 0340       Studies/Results: No results found.  Anti-infectives: Anti-infectives   None       Assessment/Plan  1.  S/P Laparoscopic Cholecystectomy: doing well, may resume regular activity without restrictions, Pt will follow up with [place] [facility] and knows to call with questions or concerns.     Dayna Alia 12/14/2012

## 2012-12-14 NOTE — Patient Instructions (Signed)
May resume regular activity without restrictions. Follow up as needed. Call with questions or concerns.  

## 2015-02-15 IMAGING — US US ABDOMEN COMPLETE
1 series · 14 of 25 positions shown · non-contrast
Comparison: None.

CLINICAL DATA: Nominal pain and elevated white cell count.
History of gallstones.

COMPLETE ABDOMINAL ULTRASOUND

[Series 1: us abdomen complete · 0.25mm/px · 14 of 82 slices shown]
[im 1/82]
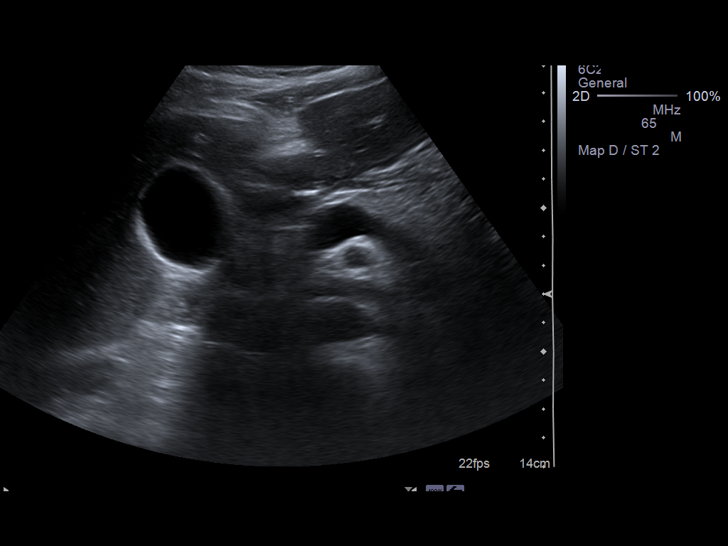
[im 7/82]
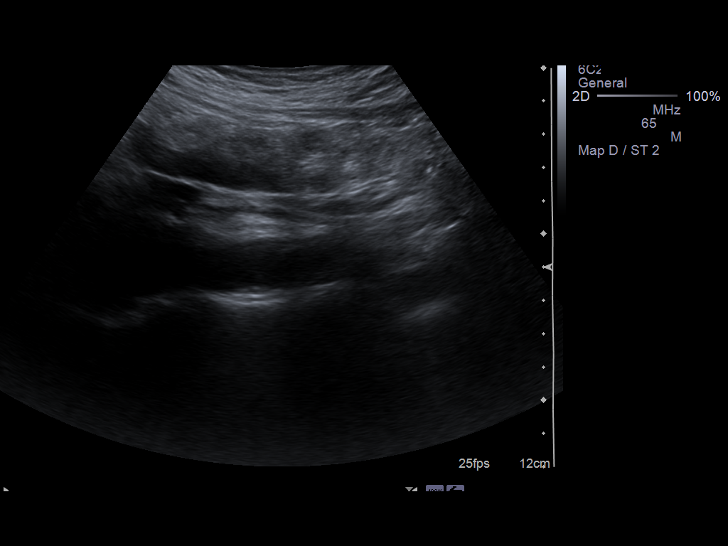
[im 14/82]
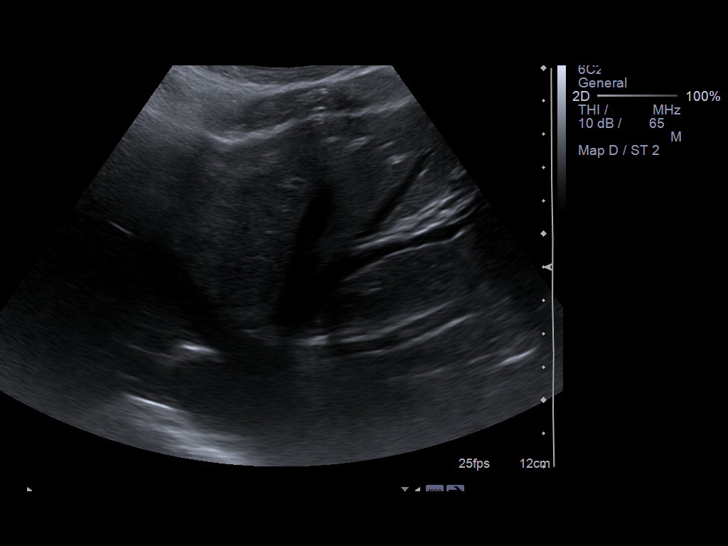
[im 21/82]
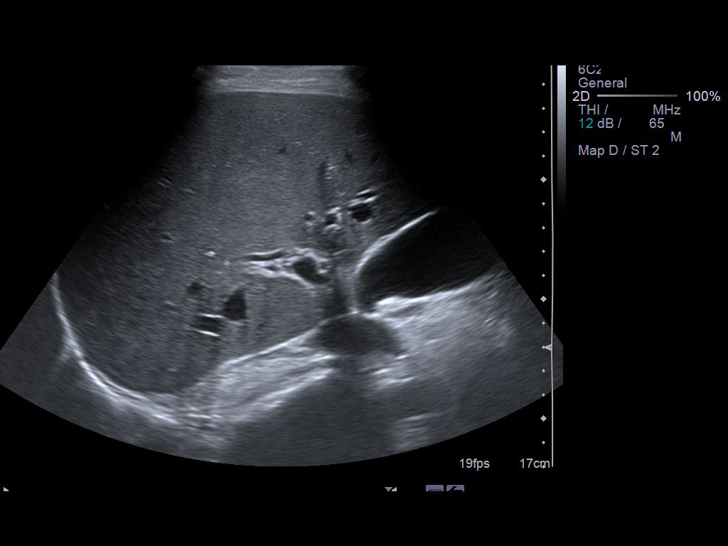
[im 28/82]
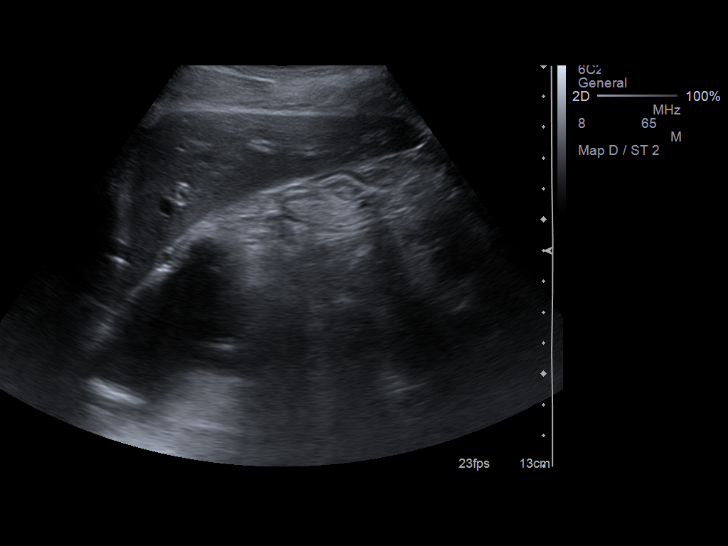
[im 31/82]
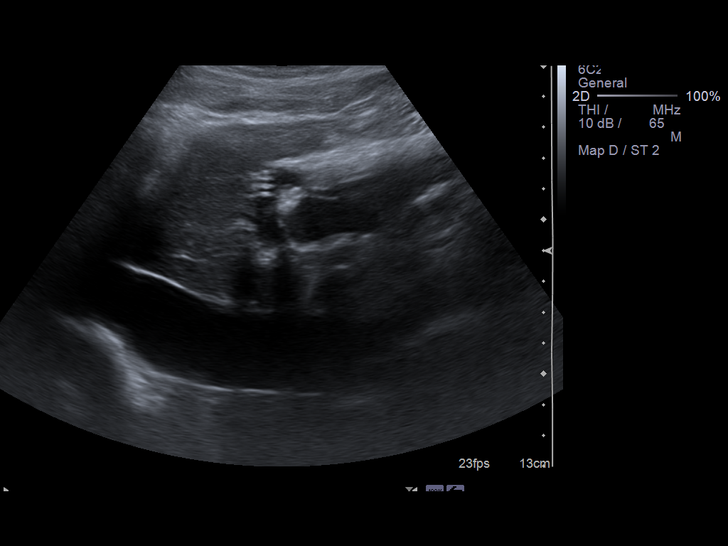
[im 38/82]
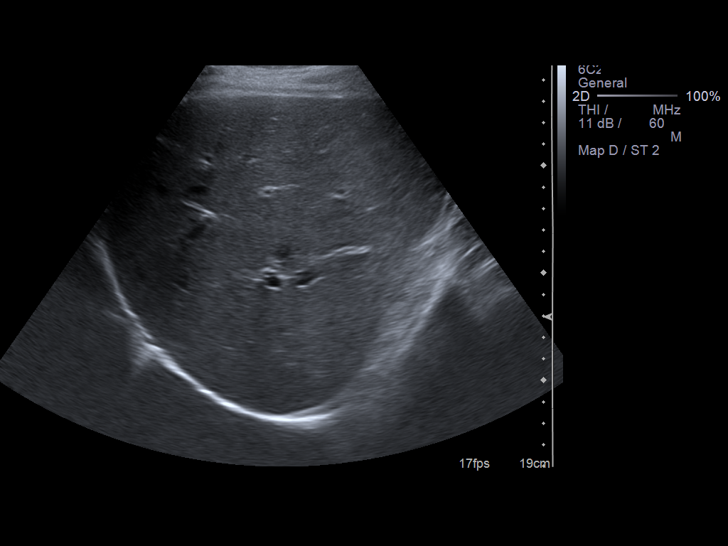
[im 44/82]
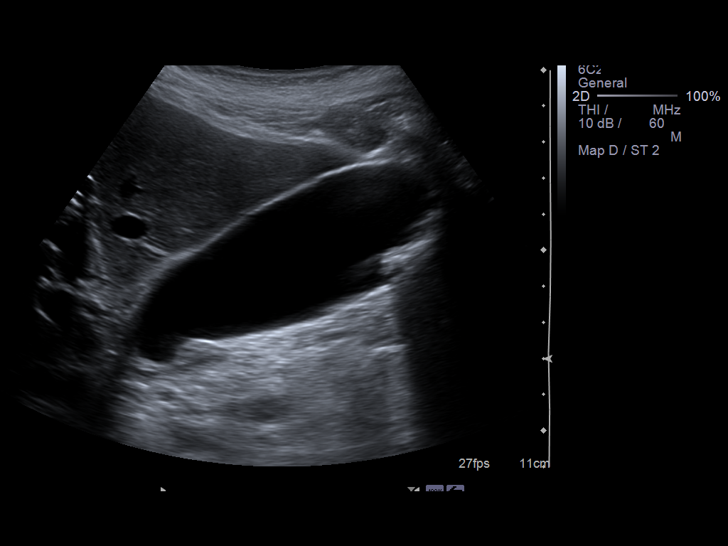
[im 51/82]
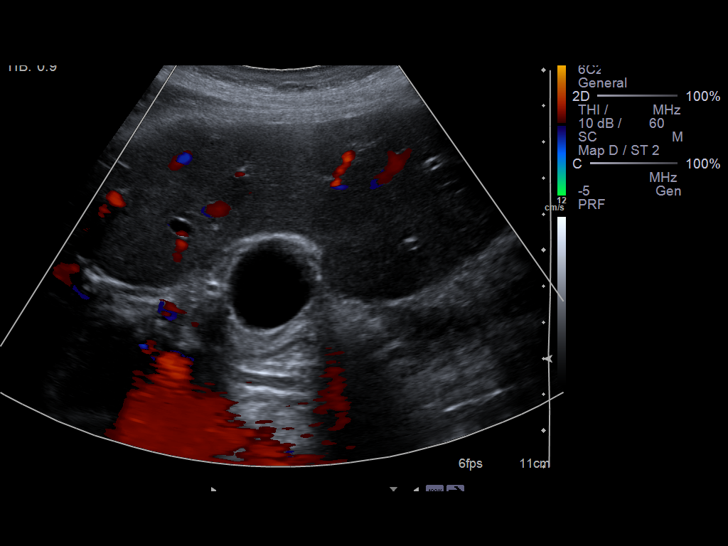
[im 55/82]
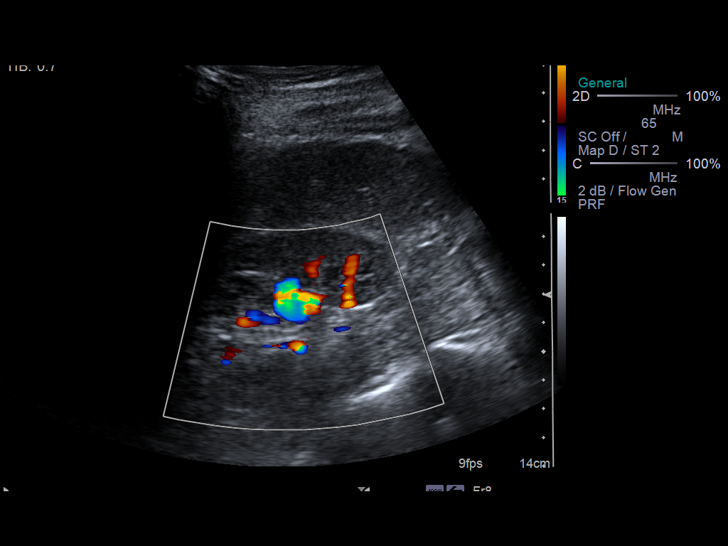
[im 61/82]
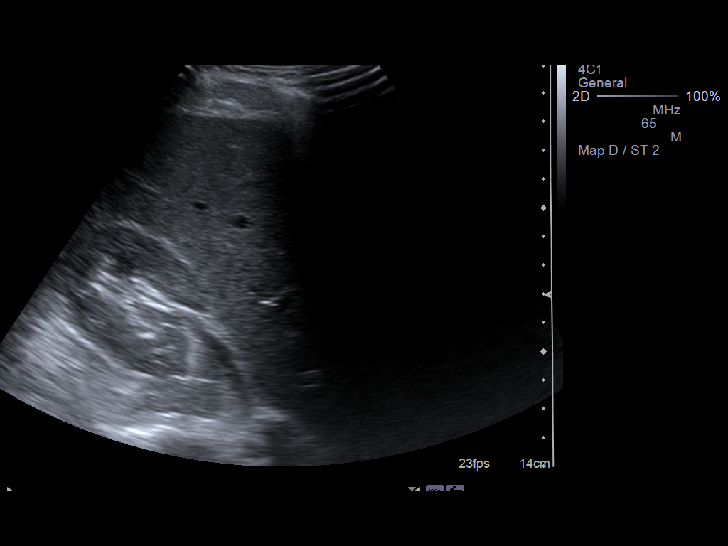
[im 68/82]
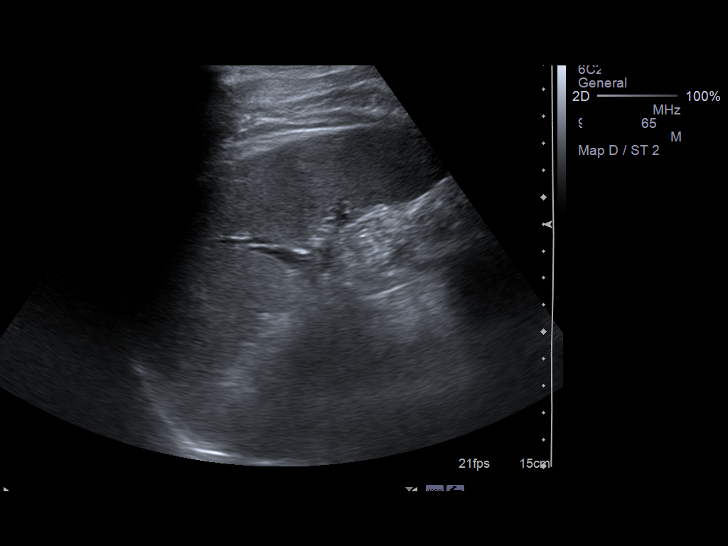
[im 75/82]
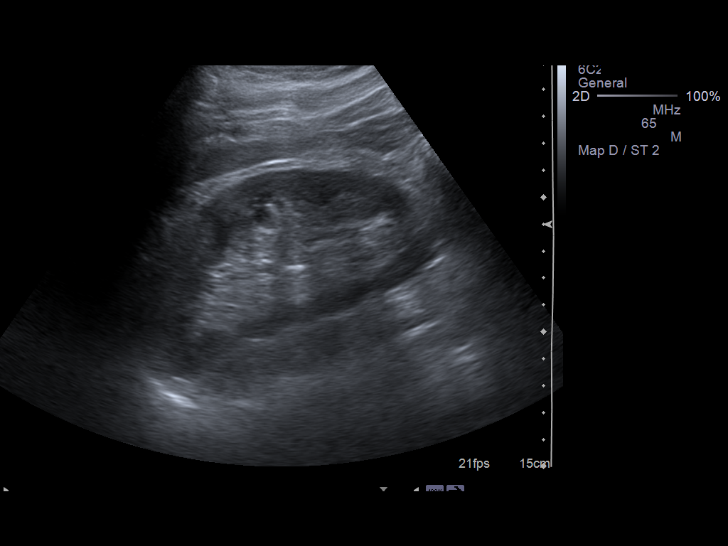
[im 82/82]
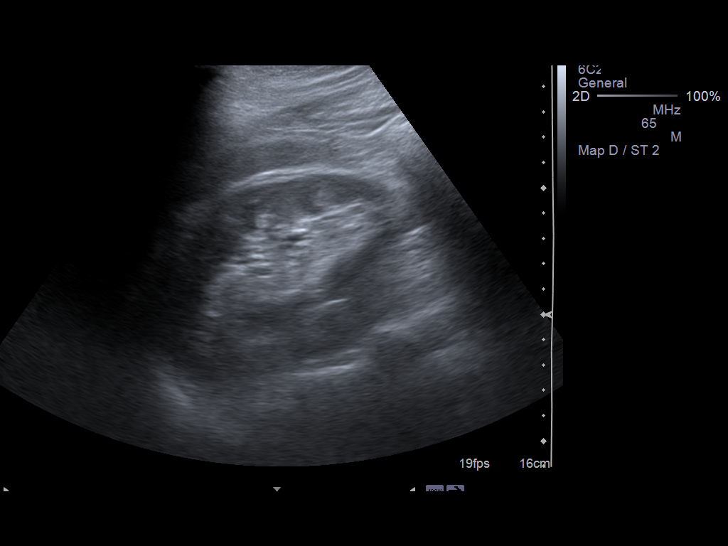

[14 of 25 positions shown; findings below may reference images not displayed]

FINDINGS: Gallbladder:  Cholelithiasis with multiple stones in the dependent
gallbladder.  Murphy's sign is negative.  No gallbladder wall
thickening or pericholecystic edema.

Common bile duct:  Normal caliber with measured diameter is 6.5 mm.

Liver:  No focal lesion identified.  Within normal limits in
parenchymal echogenicity. Limited color flow Doppler imaging
demonstrates appropriate flow direction in the main portal vein.

IVC:  Appears normal.

Pancreas:  Not visualized due to overlying bowel gas.

Spleen:  Spleen length measures 12.9 cm.  Normal parenchymal
echotexture.

Right Kidney:  Right kidney measures 11.2 cm length.  No
hydronephrosis.

Left Kidney:  Left kidney measures 12.2 cm length.  No
hydronephrosis.

Abdominal aorta:  No aneurysm identified.
IMPRESSION: Cholelithiasis.  No specific signs of cholecystitis.

## 2019-05-19 ENCOUNTER — Other Ambulatory Visit: Payer: Self-pay

## 2019-05-19 ENCOUNTER — Ambulatory Visit: Payer: 59 | Attending: Internal Medicine

## 2019-05-19 DIAGNOSIS — Z20822 Contact with and (suspected) exposure to covid-19: Secondary | ICD-10-CM

## 2019-05-19 DIAGNOSIS — Z20828 Contact with and (suspected) exposure to other viral communicable diseases: Secondary | ICD-10-CM | POA: Insufficient documentation

## 2019-05-21 LAB — NOVEL CORONAVIRUS, NAA: SARS-CoV-2, NAA: NOT DETECTED

## 2020-05-17 ENCOUNTER — Encounter: Payer: Self-pay | Admitting: *Deleted

## 2020-05-25 ENCOUNTER — Other Ambulatory Visit: Payer: Self-pay

## 2020-07-24 ENCOUNTER — Ambulatory Visit: Payer: Self-pay | Admitting: Family Medicine

## 2020-10-23 ENCOUNTER — Ambulatory Visit (HOSPITAL_BASED_OUTPATIENT_CLINIC_OR_DEPARTMENT_OTHER): Payer: 59 | Admitting: Family Medicine

## 2020-12-17 ENCOUNTER — Ambulatory Visit (HOSPITAL_BASED_OUTPATIENT_CLINIC_OR_DEPARTMENT_OTHER): Payer: 59 | Admitting: Family Medicine

## 2021-01-29 ENCOUNTER — Ambulatory Visit (HOSPITAL_BASED_OUTPATIENT_CLINIC_OR_DEPARTMENT_OTHER): Payer: 59 | Admitting: Family Medicine

## 2021-03-05 ENCOUNTER — Ambulatory Visit (HOSPITAL_BASED_OUTPATIENT_CLINIC_OR_DEPARTMENT_OTHER): Payer: 59 | Admitting: Family Medicine

## 2021-04-02 ENCOUNTER — Ambulatory Visit (HOSPITAL_BASED_OUTPATIENT_CLINIC_OR_DEPARTMENT_OTHER): Payer: 59 | Admitting: Family Medicine

## 2021-05-07 ENCOUNTER — Ambulatory Visit (HOSPITAL_BASED_OUTPATIENT_CLINIC_OR_DEPARTMENT_OTHER): Payer: 59 | Admitting: Family Medicine

## 2021-06-05 ENCOUNTER — Ambulatory Visit (HOSPITAL_BASED_OUTPATIENT_CLINIC_OR_DEPARTMENT_OTHER): Payer: 59 | Admitting: Family Medicine

## 2021-07-16 ENCOUNTER — Ambulatory Visit (HOSPITAL_BASED_OUTPATIENT_CLINIC_OR_DEPARTMENT_OTHER): Payer: 59 | Admitting: Family Medicine

## 2021-09-05 ENCOUNTER — Ambulatory Visit (HOSPITAL_BASED_OUTPATIENT_CLINIC_OR_DEPARTMENT_OTHER): Payer: 59 | Admitting: Family Medicine

## 2021-10-24 ENCOUNTER — Ambulatory Visit (HOSPITAL_BASED_OUTPATIENT_CLINIC_OR_DEPARTMENT_OTHER): Payer: 59 | Admitting: Family Medicine

## 2021-12-12 ENCOUNTER — Ambulatory Visit (HOSPITAL_BASED_OUTPATIENT_CLINIC_OR_DEPARTMENT_OTHER): Payer: 59 | Admitting: Family Medicine

## 2022-02-04 ENCOUNTER — Ambulatory Visit (HOSPITAL_BASED_OUTPATIENT_CLINIC_OR_DEPARTMENT_OTHER): Payer: 59 | Admitting: Family Medicine

## 2022-03-06 ENCOUNTER — Ambulatory Visit (HOSPITAL_BASED_OUTPATIENT_CLINIC_OR_DEPARTMENT_OTHER): Payer: 59 | Admitting: Family Medicine

## 2022-03-06 ENCOUNTER — Encounter (HOSPITAL_BASED_OUTPATIENT_CLINIC_OR_DEPARTMENT_OTHER): Payer: Self-pay

## 2022-04-09 ENCOUNTER — Ambulatory Visit (HOSPITAL_BASED_OUTPATIENT_CLINIC_OR_DEPARTMENT_OTHER): Payer: 59 | Admitting: Family Medicine

## 2022-04-25 ENCOUNTER — Ambulatory Visit (HOSPITAL_BASED_OUTPATIENT_CLINIC_OR_DEPARTMENT_OTHER): Payer: 59 | Admitting: Family Medicine

## 2022-05-21 ENCOUNTER — Ambulatory Visit (HOSPITAL_BASED_OUTPATIENT_CLINIC_OR_DEPARTMENT_OTHER): Payer: 59 | Admitting: Family Medicine

## 2022-07-02 ENCOUNTER — Ambulatory Visit (HOSPITAL_BASED_OUTPATIENT_CLINIC_OR_DEPARTMENT_OTHER): Payer: 59 | Admitting: Family Medicine

## 2022-07-29 ENCOUNTER — Encounter (HOSPITAL_BASED_OUTPATIENT_CLINIC_OR_DEPARTMENT_OTHER): Payer: Self-pay | Admitting: Family Medicine

## 2022-07-29 ENCOUNTER — Ambulatory Visit (INDEPENDENT_AMBULATORY_CARE_PROVIDER_SITE_OTHER): Payer: Managed Care, Other (non HMO) | Admitting: Family Medicine

## 2022-07-29 DIAGNOSIS — F419 Anxiety disorder, unspecified: Secondary | ICD-10-CM | POA: Diagnosis not present

## 2022-07-29 DIAGNOSIS — Z8249 Family history of ischemic heart disease and other diseases of the circulatory system: Secondary | ICD-10-CM | POA: Diagnosis not present

## 2022-07-29 MED ORDER — SERTRALINE HCL 25 MG PO TABS: 25.0000 mg | ORAL_TABLET | Freq: Every day | ORAL | 1 refills | Status: DC

## 2022-07-29 NOTE — Assessment & Plan Note (Signed)
Given family history of heart disease with his father having heart attack at young age, feel would be reasonable to have further evaluation with cardiology.  Will proceed with referral to cardiology here at [facility]

## 2022-07-29 NOTE — Progress Notes (Signed)
New Patient Office Visit  Subjective    Patient ID: Kenneth Munoz, male    DOB: 1981/03/04  Age: 42 y.o. MRN: [id number]  CC:  Chief Complaint  Patient presents with   New Patient (Initial Visit)    Pt here to establish new care     HPI Kenneth Munoz presents to establish care Last PCP - Dr. Claiborne Billings in [facility], last visit about 5 years ago  Family history of heart disease - reports father passing when he was 31 years old.  Denies personal history of tobacco use.  Denies any current symptoms such as chest pain, lightheadedness, dizziness.  He has been more thoughtful regarding his own health and potential underlying cardiac issues as he has a daughter who was about his age when his father passed away due to heart attack.  Due to this, he is wondering about meeting with a cardiologist and having further cardiac evaluation.  Additionally, patient reports some increased concerns regarding his mental health, primarily related to increased stress, worry, anxiety.  He denies any history of being diagnosed with anxiety or depression or other mood related disorder in the past.  He feels that symptoms have been increasing over the years related to stressors at work, home family such as his daughter.  Symptoms have been increasingly impacting day-to-day life both at work and at home.  Patient is originally from [place], has lived here since 2003. Patient works as Chief Executive Officer for [employer]. Outside of work, he enjoys golfing, spending time with family, rides Interior and spatial designer.  Outpatient Encounter Medications as of 07/29/2022  Medication Sig   sertraline (ZOLOFT) 25 MG tablet Take 1 tablet (25 mg total) by mouth daily.   No facility-administered encounter medications on file as of 07/29/2022.    Past Medical History:  Diagnosis Date   Gallstones     Past Surgical History:  Procedure Laterality Date   CHOLECYSTECTOMY  11/22/2012   Procedure: LAPAROSCOPIC CHOLECYSTECTOMY;  Surgeon: Rolm Bookbinder, MD;  Location: [facility] OR;  Service: General;;    Family History  Problem Relation Age of Onset   Heart disease Father     Social History   Socioeconomic History   Marital status: Single    Spouse name: Not on file   Number of children: Not on file   Years of education: Not on file   Highest education level: Not on file  Occupational History   Not on file  Tobacco Use   Smoking status: Some Days   Smokeless tobacco: Not on file  Substance and Sexual Activity   Alcohol use: Yes    Comment: 2x/week   Drug use: No   Sexual activity: Not on file  Other Topics Concern   Not on file  Social History Narrative   ** Merged History Encounter **       Social Determinants of Health   Financial Resource Strain: Not on file  Food Insecurity: Not on file  Transportation Needs: Not on file  Physical Activity: Not on file  Stress: Not on file  Social Connections: Not on file  Intimate Partner Violence: Not on file    Objective    BP 136/83 (BP Location: Right Arm, Patient Position: Sitting, Cuff Size: Large)   Pulse (!) 113   Temp 97.6 F (36.4 C) (Oral)   Ht '5\' 10"'$  (1.778 m)   Wt 212 lb (96.2 kg)   SpO2 100%   BMI 30.42 kg/m   Physical Exam  42 year old male in no  acute distress Cardiovascular exam with borderline tachycardic rate and regular rhythm, no murmur appreciated Lungs clear to auscultation bilaterally  Assessment & Plan:   Problem List Items Addressed This Visit       Other   Anxiety    Has been having increasing symptoms over the years which have began to impact both home and work life.  We did discuss potential interventions to consider including both pharmacotherapy and non pharmacotherapy options.  After discussion, patient elected to proceed with initial low-dose SSRI to help with controlling symptoms.  We discussed potential risk, adverse effects, benefits from medication.  We will plan for follow-up in about 2 weeks to assess progress If  having good response, can continue with medication.  If having partial response, could continue with medication or look to slightly increase dose to 50 mg dose.  Additionally, could consider augmentation with use of medication such as buspirone.      Relevant Medications   sertraline (ZOLOFT) 25 MG tablet   Family history of heart disease    Given family history of heart disease with his father having heart attack at young age, feel would be reasonable to have further evaluation with cardiology.  Will proceed with referral to cardiology here at [facility]   Ambulatory referral to Cardiology    Return in about 2 weeks (around 08/12/2022) for med check, can be virtual.   Mabrey Howland J [place], MD

## 2022-07-29 NOTE — Assessment & Plan Note (Signed)
Has been having increasing symptoms over the years which have began to impact both home and work life.  We did discuss potential interventions to consider including both pharmacotherapy and non pharmacotherapy options.  After discussion, patient elected to proceed with initial low-dose SSRI to help with controlling symptoms.  We discussed potential risk, adverse effects, benefits from medication.  We will plan for follow-up in about 2 weeks to assess progress If having good response, can continue with medication.  If having partial response, could continue with medication or look to slightly increase dose to 50 mg dose.  Additionally, could consider augmentation with use of medication such as buspirone.

## 2022-08-12 ENCOUNTER — Ambulatory Visit (HOSPITAL_BASED_OUTPATIENT_CLINIC_OR_DEPARTMENT_OTHER): Payer: Managed Care, Other (non HMO) | Admitting: Family Medicine

## 2022-08-12 DIAGNOSIS — F419 Anxiety disorder, unspecified: Secondary | ICD-10-CM

## 2022-08-12 MED ORDER — SERTRALINE HCL 50 MG PO TABS: 50.0000 mg | ORAL_TABLET | Freq: Every day | ORAL | 1 refills | Status: DC

## 2022-08-12 NOTE — Assessment & Plan Note (Signed)
Patient reports that he has been doing well with sertraline.  He has been utilizing low-dose of 25 mg once a day.  He has not noted any significant side effects, did have some mild GI upset initially when starting medication, however this has resolved.  No reported sleep issues, no drowsiness, thoughts of SI or HI. We discussed options today including continuing with same dose of medication, slight dose titration.  For now, we will proceed with dose titration to 50 mg once daily.  Did discuss that with dose increase, it is possible that side effects become more apparent.  If patient does note any issues with higher dose, can always transition back to lower 25 mg dose. We will plan for follow-up in about 2 months, patient does have physical scheduled on that time, we can touch base regarding medication during that appointment.  If any issues do, before then, he may return to the office sooner as needed

## 2022-08-12 NOTE — Progress Notes (Signed)
   Virtual Visit via Telephone   I connected with  Kenneth Munoz  on 08/12/22 by telephone/telehealth and verified that I am speaking with the correct person using two identifiers.   I discussed the limitations, risks, security and privacy concerns of performing an evaluation and management service by telephone, including the higher likelihood of inaccurate diagnosis and treatment, and the availability of in person appointments.  We also discussed the likely need of an additional face to face encounter for complete and high quality delivery of care.  I also discussed with the patient that there may be a patient responsible charge related to this service. The patient expressed understanding and wishes to proceed.  Provider location is in medical facility. Patient location is at their home, different from provider location. People involved in care of the patient during this telehealth encounter were myself, my nurse/medical assistant, and my front office/scheduling team member.  Review of Systems: No fevers, chills, night sweats, weight loss, chest pain, or shortness of breath.   Objective Findings:    General: Speaking full sentences, no audible heavy breathing.  Sounds alert and appropriately interactive.    Independent interpretation of tests performed by another provider:   None.  Brief History, Exam, Impression, and Recommendations:    Anxiety Patient reports that he has been doing well with sertraline.  He has been utilizing low-dose of 25 mg once a day.  He has not noted any significant side effects, did have some mild GI upset initially when starting medication, however this has resolved.  No reported sleep issues, no drowsiness, thoughts of SI or HI. We discussed options today including continuing with same dose of medication, slight dose titration.  For now, we will proceed with dose titration to 50 mg once daily.  Did discuss that with dose increase, it is possible that side  effects become more apparent.  If patient does note any issues with higher dose, can always transition back to lower 25 mg dose. We will plan for follow-up in about 2 months, patient does have physical scheduled on that time, we can touch base regarding medication during that appointment.  If any issues do, before then, he may return to the office sooner as needed  I discussed the above assessment and treatment plan with the patient. The patient was provided an opportunity to ask questions and all were answered. The patient agreed with the plan and demonstrated an understanding of the instructions.  The patient was advised to call back or seek an in-person evaluation if the symptoms worsen or if the condition fails to improve as anticipated.  I provided 11 minutes of face to face and non-face-to-face time during this encounter date, time was needed to gather information, review chart, records, communicate/coordinate with staff remotely, as well as complete documentation.   ___________________________________________ Alycea Segoviano [place], MD, ABFM, CAQSM [facility] [place]

## 2022-09-25 ENCOUNTER — Other Ambulatory Visit (HOSPITAL_BASED_OUTPATIENT_CLINIC_OR_DEPARTMENT_OTHER): Payer: Managed Care, Other (non HMO)

## 2022-10-02 ENCOUNTER — Encounter (HOSPITAL_BASED_OUTPATIENT_CLINIC_OR_DEPARTMENT_OTHER): Payer: Managed Care, Other (non HMO) | Admitting: Family Medicine

## 2022-10-10 ENCOUNTER — Other Ambulatory Visit (HOSPITAL_BASED_OUTPATIENT_CLINIC_OR_DEPARTMENT_OTHER): Payer: Self-pay | Admitting: Family Medicine

## 2022-10-10 DIAGNOSIS — F419 Anxiety disorder, unspecified: Secondary | ICD-10-CM

## 2022-10-14 ENCOUNTER — Ambulatory Visit (HOSPITAL_BASED_OUTPATIENT_CLINIC_OR_DEPARTMENT_OTHER): Payer: Managed Care, Other (non HMO) | Admitting: Cardiology

## 2022-10-14 ENCOUNTER — Encounter (HOSPITAL_BASED_OUTPATIENT_CLINIC_OR_DEPARTMENT_OTHER): Payer: Self-pay | Admitting: Cardiology

## 2022-10-14 VITALS — BP 118/80 | HR 84 | Ht 70.0 in | Wt 201.9 lb

## 2022-10-14 DIAGNOSIS — Z7189 Other specified counseling: Secondary | ICD-10-CM | POA: Diagnosis not present

## 2022-10-14 DIAGNOSIS — R9431 Abnormal electrocardiogram [ECG] [EKG]: Secondary | ICD-10-CM | POA: Diagnosis not present

## 2022-10-14 DIAGNOSIS — Z8249 Family history of ischemic heart disease and other diseases of the circulatory system: Secondary | ICD-10-CM | POA: Diagnosis not present

## 2022-10-14 NOTE — Progress Notes (Signed)
Cardiology Office Note:    Date:  10/14/2022   ID:  Kenneth Munoz, DOB 12/03/1980, MRN [id number]  PCP:  de Peru, Raymond J, MD  Cardiologist:  Jodelle Red, MD  Referring MD: de Peru, Buren Kos, MD   CC: New patient evaluation for risk stratification  History of Present Illness:    Kenneth Munoz is a 42 y.o. male with a hx of gallstones s/p cholecystectomy 2014, who is seen as a new consult at the request of de Peru, Buren Kos, MD for evaluation and risk stratification given family history of heart disease.  He was seen by his PCP 07/29/2022 and reported a family history of heart disease. His father died at an early age presumably of a heart attack. He has a 75 year old daughter and has been motivated to pay more attention to his health. He requested a referral to cardiology.  Cardiovascular risk factors: Prior clinical ASCVD: None. Comorbid conditions: None. He denies ever having issues with chest pain or heaviness. Typically his blood pressure is well controlled at home. Metabolic syndrome/Obesity: Current weight is 201 lbs.  Chronic inflammatory conditions: None. Tobacco use history: None. Family history: His father died in his sleep when he was 33 years old, presumably of a heart attack. He does not know if his father had heart disease prior to that time. His mother is living at 47 yo and is generally in good health. He has a step-sister (different biological father) also in good health.  Prior cardiac testing and/or incidental findings on other testing: Prior echocardiogram in 2010 that was reportedly normal.  Exercise level:  Completes 20 minutes on the [employer] 5-6 days a week. No limiting symptoms. Current diet:  Eats "pretty well". Often enjoys chicken and vegetable stir-fry.   He is scheduled for lab work next week. His annual physical will be the following week.  He denies any palpitations, chest pain, shortness of breath, or peripheral edema. No lightheadedness,  headaches, syncope, orthopnea, or PND.  Past Medical History:  Diagnosis Date   Gallstones     Past Surgical History:  Procedure Laterality Date   CHOLECYSTECTOMY  11/22/2012   Procedure: LAPAROSCOPIC CHOLECYSTECTOMY;  Surgeon: Emelia Loron, MD;  Location: [facility] OR;  Service: General;;    Current Medications: Current Outpatient Medications on File Prior to Visit  Medication Sig   sertraline (ZOLOFT) 50 MG tablet TAKE 1 TABLET(50 MG) BY MOUTH DAILY   No current facility-administered medications on file prior to visit.     Allergies:   Penicillins   Social History   Tobacco Use   Smoking status: Some Days  Substance Use Topics   Alcohol use: Yes    Comment: 2x/week   Drug use: No    Family History: family history includes Heart disease in his father.  ROS:   Please see the history of present illness.  Additional pertinent ROS: Constitutional: Negative for chills, fever, night sweats, unintentional weight loss  HENT: Negative for ear pain and hearing loss.   Eyes: Negative for loss of vision and eye pain.  Respiratory: Negative for cough, sputum, wheezing.   Cardiovascular: See HPI. Gastrointestinal: Negative for abdominal pain, melena, and hematochezia.  Genitourinary: Negative for dysuria and hematuria.  Musculoskeletal: Negative for falls and myalgias.  Skin: Negative for itching and rash.  Neurological: Negative for focal weakness, focal sensory changes and loss of consciousness.  Endo/Heme/Allergies: Does not bruise/bleed easily.     EKGs/Labs/Other Studies Reviewed:    The following studies were reviewed today:  Abdominal Ultrasound  11/22/2012: Findings:  Gallbladder: Cholelithiasis with multiple stones in the dependent  gallbladder.  Murphy's sign is negative.  No gallbladder wall  thickening or pericholecystic edema.   Abdominal aorta:  No aneurysm identified.   IMPRESSION:  Cholelithiasis. No specific signs of cholecystitis.   EKG:  EKG is  personally reviewed.   10/14/2022:  NSR at 84 bpm, ST/T wave nonspecific abnormalities inferior leads  Recent Labs: No results found for requested labs within last 365 days.   Recent Lipid Panel No results found for: "CHOL", "TRIG", "HDL", "CHOLHDL", "VLDL", "LDLCALC", "LDLDIRECT"  Physical Exam:    VS:  BP 118/80 (BP Location: Right Arm, Patient Position: Sitting, Cuff Size: Normal)   Pulse 84   Ht 5\' 10"  (1.778 m)   Wt 201 lb 14.4 oz (91.6 kg)   BMI 28.97 kg/m     Wt Readings from Last 3 Encounters:  10/14/22 201 lb 14.4 oz (91.6 kg)  07/29/22 212 lb (96.2 kg)  12/14/12 163 lb 12.8 oz (74.3 kg)    GEN: Well nourished, well developed in no acute distress HEENT: Normal, moist mucous membranes NECK: No JVD CARDIAC: regular rhythm, normal S1 and S2, no rubs or gallops. No murmur. VASCULAR: Radial and DP pulses 2+ bilaterally. No carotid bruits RESPIRATORY:  Clear to auscultation without rales, wheezing or rhonchi  ABDOMEN: Soft, non-tender, non-distended MUSCULOSKELETAL:  Ambulates independently SKIN: Warm and dry, no edema NEUROLOGIC:  Alert and oriented x 3. No focal neuro deficits noted. PSYCHIATRIC:  Normal affect    ASSESSMENT:    1. Family history of heart disease   2. Cardiac risk counseling   3. Counseling on health promotion and disease prevention   4. Abnormal ECG    PLAN:    Family history of heart disease Abnormal ECG -asymptomatic -reviewed risk factors, lifestyle at length -reviewed red flag warning signs that need immediate medical attention -discussed calcium score. Would get this at age 56 (per guidelines)  Cardiac risk counseling and prevention recommendations: -recommend heart healthy/Mediterranean diet, with whole grains, fruits, vegetable, fish, lean meats, nuts, and olive oil. Limit salt. -recommend moderate walking, 3-5 times/week for 30-50 minutes each session. Aim for at least 150 minutes.week. Goal should be pace of 3 miles/hours, or walking  1.5 miles in 30 minutes -recommend avoidance of tobacco products. Avoid excess alcohol. -ASCVD risk score: The ASCVD Risk score (Arnett DK, et al., 2019) failed to calculate for the following reasons:   The 2019 ASCVD risk score is only valid for ages 75 to 23    Plan for follow up: 5 years with coronary calcium score, or sooner as needed.  Jodelle Red, MD, PhD, [facility]  [facility]    Medication Adjustments/Labs and Tests Ordered: Current medicines are reviewed at length with the patient today.  Concerns regarding medicines are outlined above.   Orders Placed This Encounter  Procedures   Lipoprotein A (LPA)   EKG 12-Lead   No orders of the defined types were placed in this encounter.  Patient Instructions  Medication Instructions:  Your physician recommends that you continue on your current medications as directed. Please refer to the Current Medication list given to you today.  *If you need a refill on your cardiac medications before your next appointment, please call your pharmacy*  Lab Work: LPa when you go for your other labs   Testing/Procedures: NONE  Follow-Up: At [facility], you and your health needs are our priority.  As part of our  continuing mission to provide you with exceptional heart care, we have created designated Provider Care Teams.  These Care Teams include your primary Cardiologist (physician) and Advanced Practice Providers (APPs -  Physician Assistants and Nurse Practitioners) who all work together to provide you with the care you need, when you need it.  We recommend signing up for the patient portal called "MyChart".  Sign up information is provided on this After Visit Summary.  MyChart is used to connect with patients for Virtual Visits (Telemedicine).  Patients are able to view lab/test results, encounter notes, upcoming appointments, etc.  Non-urgent messages can be sent to your provider as well.   To learn more about  what you can do with MyChart, go to [email or web address].    Your next appointment:   5 years   The format for your next appointment:   In Person  Provider:   Jodelle Red, MD       [facility],acting as a scribe for Jodelle Red, MD.,have documented all relevant documentation on the behalf of Jodelle Red, MD,as directed by  Jodelle Red, MD while in the presence of Jodelle Red, MD.  I, Jodelle Red, MD, have reviewed all documentation for this visit. The documentation on 11/20/22 for the exam, diagnosis, procedures, and orders are all accurate and complete.   Signed, Jodelle Red, MD PhD 10/14/2022     [facility]

## 2022-10-14 NOTE — Patient Instructions (Signed)
Medication Instructions:  Your physician recommends that you continue on your current medications as directed. Please refer to the Current Medication list given to you today.  *If you need a refill on your cardiac medications before your next appointment, please call your pharmacy*  Lab Work: LPa when you go for your other labs   Testing/Procedures: NONE  Follow-Up: At [facility], you and your health needs are our priority.  As part of our continuing mission to provide you with exceptional heart care, we have created designated Provider Care Teams.  These Care Teams include your primary Cardiologist (physician) and Advanced Practice Providers (APPs -  Physician Assistants and Nurse Practitioners) who all work together to provide you with the care you need, when you need it.  We recommend signing up for the patient portal called "MyChart".  Sign up information is provided on this After Visit Summary.  MyChart is used to connect with patients for Virtual Visits (Telemedicine).  Patients are able to view lab/test results, encounter notes, upcoming appointments, etc.  Non-urgent messages can be sent to your provider as well.   To learn more about what you can do with MyChart, go to [email or web address].    Your next appointment:   5 years   The format for your next appointment:   In Person  Provider:   Jodelle Red, MD

## 2022-10-15 ENCOUNTER — Encounter (HOSPITAL_BASED_OUTPATIENT_CLINIC_OR_DEPARTMENT_OTHER): Payer: Self-pay

## 2022-10-22 ENCOUNTER — Ambulatory Visit (HOSPITAL_BASED_OUTPATIENT_CLINIC_OR_DEPARTMENT_OTHER): Payer: Managed Care, Other (non HMO)

## 2022-10-24 LAB — LIPOPROTEIN A (LPA): Lipoprotein (a): <8.4 nmol/L (ref <75.0)

## 2022-10-30 ENCOUNTER — Ambulatory Visit (HOSPITAL_BASED_OUTPATIENT_CLINIC_OR_DEPARTMENT_OTHER): Payer: Managed Care, Other (non HMO) | Admitting: Family Medicine

## 2022-10-30 ENCOUNTER — Encounter (HOSPITAL_BASED_OUTPATIENT_CLINIC_OR_DEPARTMENT_OTHER): Payer: Self-pay | Admitting: Family Medicine

## 2022-10-30 VITALS — BP 124/76 | HR 70 | Temp 97.6°F | Ht 70.0 in | Wt 200.0 lb

## 2022-10-30 DIAGNOSIS — Z0000 Encounter for general adult medical examination without abnormal findings: Secondary | ICD-10-CM

## 2022-10-30 DIAGNOSIS — F419 Anxiety disorder, unspecified: Secondary | ICD-10-CM

## 2022-10-30 MED ORDER — SERTRALINE HCL 50 MG PO TABS: 50.0000 mg | ORAL_TABLET | Freq: Every day | ORAL | 1 refills | Status: DC

## 2022-10-30 NOTE — Patient Instructions (Signed)
  Medication Instructions:  Your physician recommends that you continue on your current medications as directed. Please refer to the Current Medication list given to you today. --If you need a refill on any your medications before your next appointment, please call your pharmacy first. If no refills are authorized on file call the office.-- Lab Work: Your physician has recommended that you have lab work today: Yes If you have labs (blood work) drawn today and your tests are completely normal, you will receive your results via MyChart message OR a phone call from our staff.  Please ensure you check your voicemail in the event that you authorized detailed messages to be left on a delegated number. If you have any lab test that is abnormal or we need to change your treatment, we will call you to review the results.  Referrals/Procedures/Imaging: No  Follow-Up: Your next appointment:   Your physician recommends that you schedule a follow-up appointment in 1 year with Dr. de Cuba.  You will receive a text message or e-mail with a link to a survey about your care and experience with us today! We would greatly appreciate your feedback!   Thanks for letting us be apart of your health journey!!  Primary Care and Sports Medicine   Dr. Raymond de Cuba   We encourage you to activate your patient portal called "MyChart".  Sign up information is provided on this After Visit Summary.  MyChart is used to connect with patients for Virtual Visits (Telemedicine).  Patients are able to view lab/test results, encounter notes, upcoming appointments, etc.  Non-urgent messages can be sent to your provider as well. To learn more about what you can do with MyChart, please visit --  [email or web address].    

## 2022-10-30 NOTE — Progress Notes (Signed)
Subjective:    CC: Annual Physical Exam  HPI:  Kenneth Munoz is a 42 y.o. presenting for annual physical  I reviewed the past medical history, family history, social history, surgical history, and allergies today and no changes were needed.  Please see the problem list section below in epic for further details.  Past Medical History: Past Medical History:  Diagnosis Date   Gallstones    Past Surgical History: Past Surgical History:  Procedure Laterality Date   CHOLECYSTECTOMY  11/22/2012   Procedure: LAPAROSCOPIC CHOLECYSTECTOMY;  Surgeon: Emelia Loron, MD;  Location: [facility] OR;  Service: General;;   Social History: Social History   Socioeconomic History   Marital status: Single    Spouse name: Not on file   Number of children: Not on file   Years of education: Not on file   Highest education level: Not on file  Occupational History   Not on file  Tobacco Use   Smoking status: Some Days   Smokeless tobacco: Not on file  Substance and Sexual Activity   Alcohol use: Yes    Comment: 2x/week   Drug use: No   Sexual activity: Not on file  Other Topics Concern   Not on file  Social History Narrative   ** Merged History Encounter **       Social Determinants of Health   Financial Resource Strain: Not on file  Food Insecurity: No Food Insecurity (10/14/2022)   Hunger Vital Sign    Worried About Running Out of Food in the Last Year: Never true    Ran Out of Food in the Last Year: Never true  Transportation Needs: No Transportation Needs (10/14/2022)   PRAPARE - Administrator, Civil Service (Medical): No    Lack of Transportation (Non-Medical): No  Physical Activity: Insufficiently Active (10/14/2022)   Exercise Vital Sign    Days of Exercise per Week: 5 days    Minutes of Exercise per Session: 20 min  Stress: Not on file  Social Connections: Not on file   Family History: Family History  Problem Relation Age of Onset   Heart disease Father     Allergies: Allergies  Allergen Reactions   Penicillins     Childhood allergy reaction unknown   Medications: See med rec.  Review of Systems: No headache, visual changes, nausea, vomiting, diarrhea, constipation, dizziness, abdominal pain, skin rash, fevers, chills, night sweats, swollen lymph nodes, weight loss, chest pain, body aches, joint swelling, muscle aches, shortness of breath, mood changes, visual or auditory hallucinations.  Objective:    BP 124/76   Pulse 70   Temp 97.6 F (36.4 C) (Oral)   Ht 5\' 10"  (1.778 m)   Wt 200 lb (90.7 kg)   SpO2 100%   BMI 28.70 kg/m   General: Well Developed, well nourished, and in no acute distress.  Neuro: Alert and oriented x3, extra-ocular muscles intact, sensation grossly intact. Cranial nerves II through XII are intact, motor, sensory, and coordinative functions are all intact. HEENT: Normocephalic, atraumatic, pupils equal round reactive to light, neck supple, no masses, no lymphadenopathy, thyroid nonpalpable. Oropharynx, nasopharynx, external ear canals are unremarkable. Skin: Warm and dry, no rashes noted.  Cardiac: Regular rate and rhythm, no murmurs rubs or gallops.  Respiratory: Clear to auscultation bilaterally. Not using accessory muscles, speaking in full sentences.  Abdominal: Soft, nontender, nondistended, positive bowel sounds, no masses, no organomegaly.  Musculoskeletal: Shoulder, elbow, wrist, hip, knee, ankle stable, and with full range of motion.  Impression and Recommendations:    Wellness examination Assessment & Plan: Routine HCM labs ordered. HCM reviewed/discussed. Anticipatory guidance regarding healthy weight, lifestyle and choices given. Recommend healthy diet.  Recommend approximately 150 minutes/week of moderate intensity exercise Recommend regular dental and vision exams Always use seatbelt/lap and shoulder restraints Recommend using smoke alarms and checking batteries at least twice a  year Recommend using sunscreen when outside Discussed tetanus immunization recommendations, patient will check when prior booster was  Orders: -     CBC with Differential/Platelet -     Comprehensive metabolic panel -     Hemoglobin A1c -     Lipid panel -     TSH Rfx on Abnormal to Free T4  Anxiety Assessment & Plan: Patient reports to be doing well with current dose of sertraline.  At last visit, we did increase dose from 25 mg to 50 mg.  He generally feels that he is doing well, denies any current issues with potential adverse reactions or side effects.  He would be interested in continuing with current dose of medication, requesting refill today.  Feel would be reasonable to continue with present dosing, refill sent to pharmacy on file  Orders: -     Sertraline HCl; Take 1 tablet (50 mg total) by mouth daily.  Dispense: 90 tablet; Refill: 1  Return in about 1 year (around 10/30/2023) for CPE.   ___________________________________________ Ashelynn Marks de [place], MD, ABFM, CAQSM Primary Care and Sports Medicine [facility] [address]

## 2022-10-30 NOTE — Assessment & Plan Note (Signed)
Routine HCM labs ordered. HCM reviewed/discussed. Anticipatory guidance regarding healthy weight, lifestyle and choices given. Recommend healthy diet.  Recommend approximately 150 minutes/week of moderate intensity exercise Recommend regular dental and vision exams Always use seatbelt/lap and shoulder restraints Recommend using smoke alarms and checking batteries at least twice a year Recommend using sunscreen when outside Discussed tetanus immunization recommendations, patient will check when prior booster was

## 2022-10-31 LAB — TSH RFX ON ABNORMAL TO FREE T4: TSH: 1.32 u[IU]/mL (ref 0.450–4.500)

## 2022-10-31 LAB — LIPID PANEL
Chol/HDL Ratio: 2.4 ratio (ref 0.0–5.0)
Cholesterol, Total: 118 mg/dL (ref 100–199)
HDL: 50 mg/dL (ref >39)
LDL Chol Calc (NIH): 54 mg/dL (ref 0–99)
Triglycerides: 65 mg/dL (ref 0–149)
VLDL Cholesterol Cal: 14 mg/dL (ref 5–40)

## 2022-10-31 LAB — CBC WITH DIFFERENTIAL/PLATELET
Basophils Absolute: 0.1 10*3/uL (ref 0.0–0.2)
Basos: 1 %
EOS (ABSOLUTE): 0.1 10*3/uL (ref 0.0–0.4)
Eos: 1 %
Hematocrit: 44 % (ref 37.5–51.0)
Hemoglobin: 15.4 g/dL (ref 13.0–17.7)
Immature Grans (Abs): 0 10*3/uL (ref 0.0–0.1)
Immature Granulocytes: 1 %
Lymphocytes Absolute: 1.8 10*3/uL (ref 0.7–3.1)
Lymphs: 28 %
MCH: 31.3 pg (ref 26.6–33.0)
MCHC: 35 g/dL (ref 31.5–35.7)
MCV: 89 fL (ref 79–97)
Monocytes Absolute: 0.6 10*3/uL (ref 0.1–0.9)
Monocytes: 9 %
Neutrophils Absolute: 3.8 10*3/uL (ref 1.4–7.0)
Neutrophils: 60 %
Platelets: 299 10*3/uL (ref 150–450)
RBC: 4.92 x10E6/uL (ref 4.14–5.80)
RDW: 12.5 % (ref 11.6–15.4)
WBC: 6.4 10*3/uL (ref 3.4–10.8)

## 2022-10-31 LAB — COMPREHENSIVE METABOLIC PANEL
ALT: 13 IU/L (ref 0–44)
AST: 19 IU/L (ref 0–40)
Albumin/Globulin Ratio: 1.8
Albumin: 4.8 g/dL (ref 4.1–5.1)
Alkaline Phosphatase: 94 IU/L (ref 44–121)
BUN/Creatinine Ratio: 15 (ref 9–20)
BUN: 12 mg/dL (ref 6–20)
Bilirubin Total: 0.9 mg/dL (ref 0.0–1.2)
CO2: 21 mmol/L (ref 20–29)
Calcium: 9.7 mg/dL (ref 8.7–10.2)
Chloride: 103 mmol/L (ref 96–106)
Creatinine, Ser: 0.8 mg/dL (ref 0.76–1.27)
Globulin, Total: 2.6 g/dL (ref 1.5–4.5)
Glucose: 91 mg/dL (ref 70–99)
Potassium: 4.1 mmol/L (ref 3.5–5.2)
Sodium: 138 mmol/L (ref 134–144)
Total Protein: 7.4 g/dL (ref 6.0–8.5)
eGFR: 115 mL/min/{1.73_m2} (ref >59)

## 2022-10-31 LAB — HEMOGLOBIN A1C
Est. average glucose Bld gHb Est-mCnc: 105 mg/dL
Hgb A1c MFr Bld: 5.3 % (ref 4.8–5.6)

## 2022-11-20 ENCOUNTER — Encounter (HOSPITAL_BASED_OUTPATIENT_CLINIC_OR_DEPARTMENT_OTHER): Payer: Self-pay | Admitting: Cardiology

## 2022-11-23 NOTE — Assessment & Plan Note (Signed)
Patient reports to be doing well with current dose of sertraline.  At last visit, we did increase dose from 25 mg to 50 mg.  He generally feels that he is doing well, denies any current issues with potential adverse reactions or side effects.  He would be interested in continuing with current dose of medication, requesting refill today.  Feel would be reasonable to continue with present dosing, refill sent to pharmacy on file

## 2023-06-17 ENCOUNTER — Telehealth (HOSPITAL_BASED_OUTPATIENT_CLINIC_OR_DEPARTMENT_OTHER): Payer: Self-pay | Admitting: *Deleted

## 2023-06-17 DIAGNOSIS — F419 Anxiety disorder, unspecified: Secondary | ICD-10-CM

## 2023-06-17 MED ORDER — SERTRALINE HCL 50 MG PO TABS: 50.0000 mg | ORAL_TABLET | Freq: Every day | ORAL | 1 refills | Status: DC

## 2023-06-17 NOTE — Addendum Note (Signed)
Addended by: DE Peru, Thoma Paulsen J on: 06/17/2023 08:02 AM   Modules accepted: Orders

## 2023-06-17 NOTE — Telephone Encounter (Signed)
Pt requesting refill on sertraline 50 mg to [facility] [address]

## 2023-06-26 ENCOUNTER — Other Ambulatory Visit: Payer: Self-pay

## 2023-06-26 ENCOUNTER — Emergency Department (HOSPITAL_BASED_OUTPATIENT_CLINIC_OR_DEPARTMENT_OTHER)
Admission: EM | Admit: 2023-06-26 | Discharge: 2023-06-26 | Disposition: A | Discharge status: Home / Self Care | Payer: Managed Care, Other (non HMO) | Attending: Emergency Medicine | Admitting: Emergency Medicine

## 2023-06-26 DIAGNOSIS — T782XXA Anaphylactic shock, unspecified, initial encounter: Secondary | ICD-10-CM | POA: Diagnosis not present

## 2023-06-26 DIAGNOSIS — T7840XA Allergy, unspecified, initial encounter: Secondary | ICD-10-CM | POA: Diagnosis present

## 2023-06-26 MED ORDER — PREDNISONE 20 MG PO TABS: 40.0000 mg | ORAL_TABLET | Freq: Every day | ORAL | 0 refills | Status: DC

## 2023-06-26 MED ORDER — METHYLPREDNISOLONE SODIUM SUCC 125 MG IJ SOLR
125.0000 mg | Freq: Once | INTRAMUSCULAR | Status: AC
  Administered 2023-06-26: 125 mg via INTRAVENOUS
  Filled 2023-06-26: qty 2

## 2023-06-26 MED ORDER — EPINEPHRINE 0.3 MG/0.3ML IJ SOAJ: 0.3000 mg | INTRAMUSCULAR | 0 refills | Status: DC | PRN

## 2023-06-26 MED ORDER — EPINEPHRINE 0.3 MG/0.3ML IJ SOAJ: 0.3000 mg | INTRAMUSCULAR | 2 refills | Status: AC | PRN

## 2023-06-26 MED ORDER — FAMOTIDINE IN NACL 20-0.9 MG/50ML-% IV SOLN
20.0000 mg | Freq: Once | INTRAVENOUS | Status: AC
  Administered 2023-06-26: 20 mg via INTRAVENOUS
  Filled 2023-06-26: qty 50

## 2023-06-26 NOTE — ED Provider Notes (Signed)
 [place] EMERGENCY DEPARTMENT AT [facility] Provider Note   CSN: [id number] Arrival date & time: 06/26/23  1920     History  Chief Complaint  Patient presents with   Allergic Reaction    Kenneth Munoz is a 43 y.o. male.  Patient is a 43 year old male with a history of cholecystectomy presenting today after having a anaphylactic reaction.  Patient was getting a wreath out of the attic when he felt something poke his hand.  Shortly after that he broke out in a rash and then started having trouble breathing and felt like his throat was closing.  His daughter had an EpiPen  and he gave himself the EpiPen  which he reports he immediately started feeling better and called 911.  He also took 50 mg of Benadryl  prior to arrival.  This occurred at about 7:00 this evening.  He denies ever having a prior history of an allergic reaction in the past.  The history is provided by the patient.  Allergic Reaction      Home Medications Prior to Admission medications   Medication Sig Start Date End Date Taking? Authorizing Provider  predniSONE  (DELTASONE ) 20 MG tablet Take 2 tablets (40 mg total) by mouth daily. 06/26/23  Yes Doretha Folks, MD  EPINEPHrine  0.3 mg/0.3 mL IJ SOAJ injection Inject 0.3 mg into the muscle as needed for anaphylaxis. 06/26/23   Doretha Folks, MD  sertraline  (ZOLOFT ) 50 MG tablet Take 1 tablet (50 mg total) by mouth daily. 06/17/23   de [place], Raymond J, MD      Allergies    Penicillins    Review of Systems   Review of Systems  Physical Exam Updated Vital Signs BP 120/68   Pulse 73   Temp 97.8 F (36.6 C)   Resp 15   Ht 5' 11 (1.803 m)   Wt 90.7 kg   SpO2 96%   BMI 27.89 kg/m  Physical Exam Vitals and nursing note reviewed.  Constitutional:      General: He is not in acute distress.    Appearance: He is well-developed.  HENT:     Head: Normocephalic and atraumatic.  Eyes:     Conjunctiva/sclera: Conjunctivae normal.     Pupils: Pupils are  equal, round, and reactive to light.  Cardiovascular:     Rate and Rhythm: Normal rate and regular rhythm.     Heart sounds: No murmur heard. Pulmonary:     Effort: Pulmonary effort is normal. No respiratory distress.     Breath sounds: Normal breath sounds. No wheezing or rales.  Abdominal:     General: There is no distension.     Palpations: Abdomen is soft.     Tenderness: There is no abdominal tenderness. There is no guarding or rebound.  Musculoskeletal:        General: No tenderness. Normal range of motion.     Cervical back: Normal range of motion and neck supple.  Skin:    General: Skin is warm and dry.     Findings: Rash present. No erythema. Rash is urticarial.     Comments: Urticarial rash present over the torso and arms  Neurological:     Mental Status: He is alert and oriented to person, place, and time.  Psychiatric:        Behavior: Behavior normal.     ED Results / Procedures / Treatments   Labs (all labs ordered are listed, but only abnormal results are displayed) Labs Reviewed - No data to display  EKG EKG Interpretation Date/Time:  Friday June 26 2023 19:29:28 EST Ventricular Rate:  91 PR Interval:  128 QRS Duration:  86 QT Interval:  332 QTC Calculation: 408 R Axis:   70  Text Interpretation: Normal sinus rhythm Cannot rule out Inferior infarct , age undetermined No previous ECGs available Confirmed by Doretha Folks ([id number]) on 06/26/2023 8:00:51 PM  Radiology No results found.  Procedures Procedures    Medications Ordered in ED Medications  methylPREDNISolone  sodium succinate (SOLU-MEDROL ) 125 mg/2 mL injection 125 mg (125 mg Intravenous Given 06/26/23 2042)  famotidine  (PEPCID ) IVPB 20 mg premix (0 mg Intravenous Stopped 06/26/23 2115)    ED Course/ Medical Decision Making/ A&P                                 Medical Decision Making Risk Prescription drug management.   Patient presenting today with history consistent with  anaphylaxis most likely to a hornet sting which she had found to hornets in his house earlier this week.  However he did not see what stung him today.  Patient's daughter had an EpiPen  at home which he used which was the pediatric dose and took 50 mg of Benadryl .  Here patient has no further airway complaints.  Still has urticaria.  Will give Pepcid  and Solu-Medrol .  Will watch patient for a total of 3 hours in the emergency room which would be a total of 4 hours observation.  Patient will need EpiPen  for home in the future. After steroids and Pepcid  on repeat evaluation patient's rash is now completely gone except for localized reaction where he was stung on his hand.  At this time he appears stable for discharge home.  He was given prescription for prednisone  and EpiPen .       Final Clinical Impression(s) / ED Diagnoses Final diagnoses:  Anaphylaxis, initial encounter    Rx / DC Orders ED Discharge Orders          Ordered    EPINEPHrine  0.3 mg/0.3 mL IJ SOAJ injection  As needed,   Status:  Discontinued        06/26/23 2304    predniSONE  (DELTASONE ) 20 MG tablet  Daily        06/26/23 2304    EPINEPHrine  0.3 mg/0.3 mL IJ SOAJ injection  As needed        06/26/23 2308              Doretha Folks, MD 06/26/23 2308

## 2023-06-26 NOTE — ED Triage Notes (Signed)
 Pt POV from home reporting hives and SOB after possibly being stung by something on R hand. Was taking wreath down from attic and felt a sting. Took daughter's epi pen and benadryl  with improvement, hives and hand swelling noted.

## 2023-06-26 NOTE — Discharge Instructions (Signed)
 Take then dose of steroids tomorrow which need to be taken once a day.  You can do Benadryl  every 6 hours as needed.

## 2023-08-17 NOTE — Progress Notes (Unsigned)
 New Patient Note  RE: Kenneth Munoz MRN: [id number] DOB: 24-Jun-1980 Date of Office Visit: 08/18/2023  Consult requested by: de Peru, Raymond J, MD Primary care provider: de Peru, Raymond J, MD  Chief Complaint: No chief complaint on file.  History of Present Illness: I had the pleasure of seeing Kenneth Munoz for initial evaluation at the [facility] on 08/17/2023. He is a 43 y.o. male, who is referred here by de Peru, Buren Kos, MD for the evaluation of ***.  Discussed the use of AI scribe software for clinical note transcription with the patient, who gave verbal consent to proceed.  History of Present Illness             06/26/2023 ER visit: "Patient is a 43 year old male with a history of cholecystectomy presenting today after having a anaphylactic reaction. Patient was getting a wreath out of the attic when he felt something poke his hand. Shortly after that he broke out in a rash and then started having trouble breathing and felt like his throat was closing. His daughter had an EpiPen and he gave himself the EpiPen which he reports he immediately started feeling better and called 911. He also took 50 mg of Benadryl prior to arrival. This occurred at about 7:00 this evening. He denies ever having a prior history of an allergic reaction in the past.   Patient presenting today with history consistent with anaphylaxis most likely to a hornet sting which she had found to hornets in his house earlier this week.  However he did not see what stung him today.  Patient's daughter had an EpiPen at home which he used which was the pediatric dose and took 50 mg of Benadryl.  Here patient has no further airway complaints.  Still has urticaria.  Will give Pepcid and Solu-Medrol.  Will watch patient for a total of 3 hours in the emergency room which would be a total of 4 hours observation.  Patient will need EpiPen for home in the future. After steroids and Pepcid on repeat  evaluation patient's rash is now completely gone except for localized reaction where he was stung on his hand.  At this time he appears stable for discharge home.  He was given prescription for prednisone and EpiPen."  Assessment and Plan: [name] is a 43 y.o. male with: ***  Assessment and Plan               No follow-ups on file.  No orders of the defined types were placed in this encounter.  Lab Orders  No laboratory test(s) ordered today    Other allergy screening: Asthma: {Blank single:19197::"yes","no"} Rhino conjunctivitis: {Blank single:19197::"yes","no"} Food allergy: {Blank single:19197::"yes","no"} Medication allergy: {Blank single:19197::"yes","no"} Hymenoptera allergy: {Blank single:19197::"yes","no"} Urticaria: {Blank single:19197::"yes","no"} Eczema:{Blank single:19197::"yes","no"} History of recurrent infections suggestive of immunodeficency: {Blank single:19197::"yes","no"}  Diagnostics: Spirometry:  Tracings reviewed. His effort: {Blank single:19197::"Good reproducible efforts.","It was hard to get consistent efforts and there is a question as to whether this reflects a maximal maneuver.","Poor effort, data can not be interpreted."} FVC: ***L FEV1: ***L, ***% predicted FEV1/FVC ratio: ***% Interpretation: {Blank single:19197::"Spirometry consistent with mild obstructive disease","Spirometry consistent with moderate obstructive disease","Spirometry consistent with severe obstructive disease","Spirometry consistent with possible restrictive disease","Spirometry consistent with mixed obstructive and restrictive disease","Spirometry uninterpretable due to technique","Spirometry consistent with normal pattern","No overt abnormalities noted given today's efforts"}.  Please see scanned spirometry results for details.  Skin Testing: {Blank single:19197::"Select foods","Environmental allergy panel","Environmental allergy panel and select foods","Food allergy  panel","None","Deferred due to recent antihistamines use"}. *** Results discussed with patient/family.   Past Medical History: Patient Active Problem List   Diagnosis Date Noted   Wellness examination 10/30/2022   Anxiety 07/29/2022   Family history of heart disease 07/29/2022   Biliary colic 11/22/2012   Hyperglycemia 11/22/2012   Hypokalemia 11/22/2012   Past Medical History:  Diagnosis Date   Gallstones    Past Surgical History: Past Surgical History:  Procedure Laterality Date   CHOLECYSTECTOMY  11/22/2012   Procedure: LAPAROSCOPIC CHOLECYSTECTOMY;  Surgeon: Emelia Loron, MD;  Location: [facility] OR;  Service: General;;   Medication List:  Current Outpatient Medications  Medication Sig Dispense Refill   EPINEPHrine 0.3 mg/0.3 mL IJ SOAJ injection Inject 0.3 mg into the muscle as needed for anaphylaxis. 2 each 2   predniSONE (DELTASONE) 20 MG tablet Take 2 tablets (40 mg total) by mouth daily. 10 tablet 0   sertraline (ZOLOFT) 50 MG tablet Take 1 tablet (50 mg total) by mouth daily. 90 tablet 1   No current facility-administered medications for this visit.   Allergies: Allergies  Allergen Reactions   Penicillins     Childhood allergy reaction unknown   Social History: Social History   Socioeconomic History   Marital status: Married    Spouse name: Not on file   Number of children: Not on file   Years of education: Not on file   Highest education level: Not on file  Occupational History   Not on file  Tobacco Use   Smoking status: Some Days   Smokeless tobacco: Not on file  Substance and Sexual Activity   Alcohol use: Yes    Comment: 2x/week   Drug use: No   Sexual activity: Not on file  Other Topics Concern   Not on file  Social History Narrative   ** Merged History Encounter **       Social Drivers of Health   Financial Resource Strain: Not on file  Food Insecurity: No Food Insecurity (10/14/2022)   Hunger Vital Sign    Worried About Running Out of  Food in the Last Year: Never true    Ran Out of Food in the Last Year: Never true  Transportation Needs: No Transportation Needs (10/14/2022)   PRAPARE - Administrator, Civil Service (Medical): No    Lack of Transportation (Non-Medical): No  Physical Activity: Insufficiently Active (10/14/2022)   Exercise Vital Sign    Days of Exercise per Week: 5 days    Minutes of Exercise per Session: 20 min  Stress: Not on file  Social Connections: Not on file   Lives in a ***. Smoking: *** Occupation: ***  Environmental HistorySurveyor, minerals in the house: Copywriter, advertising in the family room: {Blank single:19197::"yes","no"} Carpet in the bedroom: {Blank single:19197::"yes","no"} Heating: {Blank single:19197::"electric","gas","heat pump"} Cooling: {Blank single:19197::"central","window","heat pump"} Pet: {Blank single:19197::"yes ***","no"}  Family History: Family History  Problem Relation Age of Onset   Heart disease Father    Problem                               Relation Asthma                                   *** Eczema                                ***  Food allergy                          *** Allergic rhino conjunctivitis     ***  Review of Systems  Constitutional:  Negative for appetite change, chills, fever and unexpected weight change.  HENT:  Negative for congestion and rhinorrhea.   Eyes:  Negative for itching.  Respiratory:  Negative for cough, chest tightness, shortness of breath and wheezing.   Cardiovascular:  Negative for chest pain.  Gastrointestinal:  Negative for abdominal pain.  Genitourinary:  Negative for difficulty urinating.  Skin:  Negative for rash.  Neurological:  Negative for headaches.    Objective: There were no vitals taken for this visit. There is no height or weight on file to calculate BMI. Physical Exam Vitals and nursing note reviewed.  Constitutional:      Appearance: Normal appearance. He is  well-developed.  HENT:     Head: Normocephalic and atraumatic.     Right Ear: Tympanic membrane and external ear normal.     Left Ear: Tympanic membrane and external ear normal.     Nose: Nose normal.     Mouth/Throat:     Mouth: Mucous membranes are moist.     Pharynx: Oropharynx is clear.  Eyes:     Conjunctiva/sclera: Conjunctivae normal.  Cardiovascular:     Rate and Rhythm: Normal rate and regular rhythm.     Heart sounds: Normal heart sounds. No murmur heard.    No friction rub. No gallop.  Pulmonary:     Effort: Pulmonary effort is normal.     Breath sounds: Normal breath sounds. No wheezing, rhonchi or rales.  Musculoskeletal:     Cervical back: Neck supple.  Skin:    General: Skin is warm.     Findings: No rash.  Neurological:     Mental Status: He is alert and oriented to person, place, and time.  Psychiatric:        Behavior: Behavior normal.    The plan was reviewed with the patient/family, and all questions/concerned were addressed.  It was my pleasure to see Duward today and participate in his care. Please feel free to contact me with any questions or concerns.  Sincerely,  Wyline Mood, DO Allergy & Immunology  [facility] office: [phone] [facility] office: [phone]

## 2023-08-18 ENCOUNTER — Ambulatory Visit (INDEPENDENT_AMBULATORY_CARE_PROVIDER_SITE_OTHER): Payer: Self-pay | Admitting: Allergy

## 2023-08-18 ENCOUNTER — Encounter: Payer: Self-pay | Admitting: Allergy

## 2023-08-18 ENCOUNTER — Other Ambulatory Visit: Payer: Self-pay

## 2023-08-18 VITALS — BP 122/80 | HR 97 | Temp 98.0°F | Resp 18 | Ht 70.47 in | Wt 206.0 lb

## 2023-08-18 DIAGNOSIS — Z880 Allergy status to penicillin: Secondary | ICD-10-CM

## 2023-08-18 DIAGNOSIS — Z91038 Other insect allergy status: Secondary | ICD-10-CM | POA: Diagnosis not present

## 2023-08-18 DIAGNOSIS — J3089 Other allergic rhinitis: Secondary | ICD-10-CM | POA: Diagnosis not present

## 2023-08-18 DIAGNOSIS — Z8709 Personal history of other diseases of the respiratory system: Secondary | ICD-10-CM

## 2023-08-18 MED ORDER — NEFFY 2 MG/0.1ML NA SOLN: 1.0000 | NASAL | 1 refills | Status: AC | PRN

## 2023-08-18 NOTE — Patient Instructions (Addendum)
 Allergic reactions  I have prescribed epinephrine device (Neffy) and demonstrated proper use. For mild symptoms you can take over the counter antihistamines such as Benadryl 1-2 tablets = 25-50mg  and monitor symptoms closely. If symptoms worsen or if you have severe symptoms including breathing issues, throat closure, significant swelling, whole body hives, severe diarrhea and vomiting, lightheadedness then spray Neffy in the nose and seek immediate medical care afterwards. Do not use any nasal sprays for 2 weeks afterwards.  If Neffy is not covered let me know.   Get bloodwork If positive, will recommend allergy injections which are over 95% effective. If negative, will recommend skin testing at our [facility] office due to severity of history. This is done once per month. We are ordering labs, so please allow 1-2 weeks for the results to come back. With the newly implemented Cures Act, the labs might be visible to you at the same time that they become visible to me. However, I will not address the results until all of the results are back, so please be patient.  In the meantime, continue recommendations in your patient instructions, including avoidance measures (if applicable), until you hear from me.  History of asthma Normal breathing test today.  Environmental allergies Monitor symptoms. Use over the counter antihistamines such as Zyrtec (cetirizine), Claritin (loratadine), Allegra (fexofenadine), or Xyzal (levocetirizine) daily as needed. May take twice a day during allergy flares. May switch antihistamines every few months. Consider environmental allergy testing in future.   Penicillin allergy: Consider penicillin allergy skin testing and in office drug challenge in the future.  Over 90% of people with history of penicillin allergy which occurred over 10 years ago are found to be non-allergic.  You must be off antihistamines for 3-5 days before. Must be in good health and not ill. No  vaccines/injections/antibiotics within the past 7 days. Plan on being in the office for 2-3 hours and must bring in the drug you want to do the oral challenge for - will send in prescription to pick up a few days before. You must call to schedule an appointment and specify it's for a drug challenge.   Follow up in 1 year or sooner if needed.

## 2023-08-28 ENCOUNTER — Encounter: Payer: Self-pay | Admitting: Allergy

## 2023-08-28 LAB — HYMENOPTERA VENOM ALLERGY II
Bumblebee: <0.1 kU/L
Hornet, White Face, IgE: 2.36 kU/L — AB
Hornet, Yellow, IgE: 1.1 kU/L — AB
I001-IgE Honeybee: <0.1 kU/L
I003-IgE Yellow Jacket: 2.41 kU/L — AB
I004-IgE Paper Wasp: 3.03 kU/L — AB
I208-IgE Api m 1: <0.1 kU/L
I209-IgE Ves v 5: 2.6 kU/L — AB
I210-IgE Pol d 5: 2.74 kU/L — AB
I211-IgE Ves v 1: <0.1 kU/L
I214-IgE Api m 2: <0.1 kU/L
I215-IgE Api m 3: <0.1 kU/L
I216-IgE Api m 5: <0.1 kU/L
I217-IgE Api m 10: <0.1 kU/L
Tryptase: 6.7 ug/L (ref 2.2–13.2)

## 2023-08-28 LAB — ALLERGEN COMPONENT COMMENTS

## 2023-08-28 LAB — ALLERGEN FIRE ANT: I070-IgE Fire Ant (Invicta): 2.21 kU/L — AB

## 2023-11-03 ENCOUNTER — Encounter (HOSPITAL_BASED_OUTPATIENT_CLINIC_OR_DEPARTMENT_OTHER): Payer: Managed Care, Other (non HMO) | Admitting: Family Medicine

## 2024-01-20 ENCOUNTER — Encounter (HOSPITAL_BASED_OUTPATIENT_CLINIC_OR_DEPARTMENT_OTHER): Payer: Self-pay | Admitting: Family Medicine

## 2024-01-20 DIAGNOSIS — F419 Anxiety disorder, unspecified: Secondary | ICD-10-CM

## 2024-01-22 MED ORDER — SERTRALINE HCL 50 MG PO TABS: 50.0000 mg | ORAL_TABLET | Freq: Every day | ORAL | 1 refills | Status: DC

## 2024-01-22 NOTE — Telephone Encounter (Signed)
 Copied from CRM [id number]. Topic: Clinical - Prescription Issue >> Jan 22, 2024 10:49 AM Travis F wrote: Reason for CRM: Patient is calling in because he was told his sertraline  (ZOLOFT ) 50 MG tablet [[id number]] prescription could not be filled by the pharmacy until they received an approval from the provider. Patient says it has been a week and he hasn't heard anything back from the office. Patient is requesting this be settled today so he can get his medication. Please advise.

## 2024-03-10 ENCOUNTER — Ambulatory Visit (INDEPENDENT_AMBULATORY_CARE_PROVIDER_SITE_OTHER): Admitting: Family Medicine

## 2024-03-10 VITALS — BP 138/77 | HR 83 | Temp 97.8°F | Resp 16 | Ht 70.47 in | Wt 216.4 lb

## 2024-03-10 DIAGNOSIS — F419 Anxiety disorder, unspecified: Secondary | ICD-10-CM | POA: Diagnosis not present

## 2024-03-10 DIAGNOSIS — Z0000 Encounter for general adult medical examination without abnormal findings: Secondary | ICD-10-CM | POA: Diagnosis not present

## 2024-03-10 DIAGNOSIS — R0683 Snoring: Secondary | ICD-10-CM | POA: Insufficient documentation

## 2024-03-10 MED ORDER — ESCITALOPRAM OXALATE 10 MG PO TABS: 10.0000 mg | ORAL_TABLET | Freq: Every day | ORAL | 2 refills | Status: DC

## 2024-03-10 NOTE — Assessment & Plan Note (Signed)

## 2024-03-10 NOTE — Assessment & Plan Note (Signed)
 Has noted some trouble with falling asleep.  Also notes some snoring at night.  Does not have significant issues with daytime fatigue or dozing off during the day. We did discuss considerations and he would like to meet with sleep medicine specialist with consideration for sleep study.  Feel that this is reasonable, referral placed

## 2024-03-10 NOTE — Progress Notes (Signed)
 Subjective:    CC: Annual Physical Exam  HPI:  Kenneth Munoz is a 43 y.o. presenting for annual physical  I reviewed the past medical history, family history, social history, surgical history, and allergies today and no changes were needed.  Please see the problem list section below in epic for further details.  Past Medical History: Past Medical History:  Diagnosis Date   Gallstones    Past Surgical History: Past Surgical History:  Procedure Laterality Date   CHOLECYSTECTOMY  11/22/2012   Procedure: LAPAROSCOPIC CHOLECYSTECTOMY;  Surgeon: Donnice Bury, MD;  Location: [facility] OR;  Service: General;;   Social History: Social History   Socioeconomic History   Marital status: Married    Spouse name: Not on file   Number of children: Not on file   Years of education: Not on file   Highest education level: Not on file  Occupational History   Not on file  Tobacco Use   Smoking status: Former    Types: Cigarettes   Smokeless tobacco: Not on file  Substance and Sexual Activity   Alcohol use: Yes    Comment: 2x/week   Drug use: No   Sexual activity: Not on file  Other Topics Concern   Not on file  Social History Narrative   ** Merged History Encounter **       Social Drivers of Health   Financial Resource Strain: Not on file  Food Insecurity: No Food Insecurity (10/14/2022)   Hunger Vital Sign    Worried About Running Out of Food in the Last Year: Never true    Ran Out of Food in the Last Year: Never true  Transportation Needs: No Transportation Needs (10/14/2022)   PRAPARE - Administrator, Civil Service (Medical): No    Lack of Transportation (Non-Medical): No  Physical Activity: Insufficiently Active (10/14/2022)   Exercise Vital Sign    Days of Exercise per Week: 5 days    Minutes of Exercise per Session: 20 min  Stress: Not on file  Social Connections: Not on file   Family History: Family History  Problem Relation Age of Onset   Heart disease  Father    Allergies: Allergies  Allergen Reactions   Penicillins     Childhood allergy  reaction unknown   Wasp Venom Hives   Medications: See med rec.  Review of Systems: No headache, visual changes, nausea, vomiting, diarrhea, constipation, dizziness, abdominal pain, skin rash, fevers, chills, night sweats, swollen lymph nodes, weight loss, chest pain, body aches, joint swelling, muscle aches, shortness of breath, mood changes, visual or auditory hallucinations.  Objective:    BP 138/77 (Cuff Size: Normal)   Pulse 83   Temp 97.8 F (36.6 C) (Oral)   Resp 16   Ht 5' 10.47 (1.79 m)   Wt 216 lb 6.4 oz (98.2 kg)   SpO2 95%   BMI 30.64 kg/m   General: Well Developed, well nourished, and in no acute distress. Neuro: Alert and oriented x3, extra-ocular muscles intact, sensation grossly intact. Cranial nerves II through XII are intact, motor, sensory, and coordinative functions are all intact. HEENT: Normocephalic, atraumatic, pupils equal round reactive to light, neck supple, no masses, no lymphadenopathy, thyroid nonpalpable. Oropharynx, nasopharynx, external ear canals are unremarkable. Skin: Warm and dry, no rashes noted. Cardiac: Regular rate and rhythm, no murmurs rubs or gallops. Respiratory: Clear to auscultation bilaterally. Not using accessory muscles, speaking in full sentences. Abdominal: Soft, nontender, nondistended, positive bowel sounds, no masses, no organomegaly. Musculoskeletal: Shoulder,  elbow, wrist, hip, knee, ankle stable, and with full range of motion.  Impression and Recommendations:    Wellness examination Assessment & Plan: Routine HCM labs ordered. HCM reviewed/discussed. Anticipatory guidance regarding healthy weight, lifestyle and choices given. Recommend healthy diet.  Recommend approximately 150 minutes/week of moderate intensity exercise Recommend regular dental and vision exams Always use seatbelt/lap and shoulder restraints Recommend using smoke  alarms and checking batteries at least twice a year Recommend using sunscreen when outside Discussed immunization recommendations  Orders: -     CBC with Differential/Platelet -     Comprehensive metabolic panel with GFR -     Hemoglobin A1c -     Lipid panel -     TSH Rfx on Abnormal to Free T4  Anxiety Assessment & Plan: Overall patient has had decent symptom control with sertraline , he has noted increased appetite with observed weight gain while on medication.  As a result, he would like to consider alternative treatment options We discussed considerations, would look to initially try alternative SSRI.  Can proceed with escitalopram at 10 mg dose.  Discussed that this would be similar to current dose of sertraline  and thus would be able to directly transition from 1 SSRI to the other.  Cautioned on potential side effects.  Discussed that there may still be similar issue from a weight control standpoint but hopefully new medication is tolerated better.  If continuing to have issues, could try alternative class such as SNRI We will plan to follow-up in about 4 to 6 weeks to assess progress   Snoring Assessment & Plan: Has noted some trouble with falling asleep.  Also notes some snoring at night.  Does not have significant issues with daytime fatigue or dozing off during the day. We did discuss considerations and he would like to meet with sleep medicine specialist with consideration for sleep study.  Feel that this is reasonable, referral placed  Orders: -     Ambulatory referral to Neurology  Other orders -     Escitalopram Oxalate; Take 1 tablet (10 mg total) by mouth daily.  Dispense: 30 tablet; Refill: 2  Return in about 6 weeks (around 04/21/2024) for med check, can be virtual.   ___________________________________________ Ehren Berisha de Peru, MD, ABFM, CAQSM Primary Care and Sports Medicine [facility]

## 2024-03-10 NOTE — Assessment & Plan Note (Addendum)
 Overall patient has had decent symptom control with sertraline , he has noted increased appetite with observed weight gain while on medication.  As a result, he would like to consider alternative treatment options We discussed considerations, would look to initially try alternative SSRI.  Can proceed with escitalopram at 10 mg dose.  Discussed that this would be similar to current dose of sertraline  and thus would be able to directly transition from 1 SSRI to the other.  Cautioned on potential side effects.  Discussed that there may still be similar issue from a weight control standpoint but hopefully new medication is tolerated better.  If continuing to have issues, could try alternative class such as SNRI We will plan to follow-up in about 4 to 6 weeks to assess progress

## 2024-03-21 ENCOUNTER — Encounter (HOSPITAL_BASED_OUTPATIENT_CLINIC_OR_DEPARTMENT_OTHER): Payer: Self-pay | Admitting: Family Medicine

## 2024-03-21 NOTE — Telephone Encounter (Signed)
 Please see mychart message sent by pt and advise.

## 2024-03-24 MED ORDER — BUPROPION HCL ER (XL) 150 MG PO TB24: 150.0000 mg | ORAL_TABLET | ORAL | 2 refills | Status: DC

## 2024-04-12 LAB — CBC WITH DIFFERENTIAL/PLATELET
Basophils Absolute: 0.1 x10E3/uL (ref 0.0–0.2)
Basos: 1 %
EOS (ABSOLUTE): 0.1 x10E3/uL (ref 0.0–0.4)
Eos: 1 %
Hematocrit: 47.1 % (ref 37.5–51.0)
Hemoglobin: 15.7 g/dL (ref 13.0–17.7)
Immature Grans (Abs): 0 x10E3/uL (ref 0.0–0.1)
Immature Granulocytes: 0 %
Lymphocytes Absolute: 1.7 x10E3/uL (ref 0.7–3.1)
Lymphs: 26 %
MCH: 31.2 pg (ref 26.6–33.0)
MCHC: 33.3 g/dL (ref 31.5–35.7)
MCV: 94 fL (ref 79–97)
Monocytes Absolute: 0.6 x10E3/uL (ref 0.1–0.9)
Monocytes: 9 %
Neutrophils Absolute: 4.2 x10E3/uL (ref 1.4–7.0)
Neutrophils: 62 %
Platelets: 300 x10E3/uL (ref 150–450)
RBC: 5.03 x10E6/uL (ref 4.14–5.80)
RDW: 12.3 % (ref 11.6–15.4)
WBC: 6.6 x10E3/uL (ref 3.4–10.8)

## 2024-04-12 LAB — COMPREHENSIVE METABOLIC PANEL WITH GFR
ALT: 15 IU/L (ref 0–44)
AST: 20 IU/L (ref 0–40)
Albumin: 4.9 g/dL (ref 4.1–5.1)
Alkaline Phosphatase: 88 IU/L (ref 47–123)
BUN/Creatinine Ratio: 16 (ref 9–20)
BUN: 13 mg/dL (ref 6–24)
Bilirubin Total: 0.6 mg/dL (ref 0.0–1.2)
CO2: 22 mmol/L (ref 20–29)
Calcium: 9.7 mg/dL (ref 8.7–10.2)
Chloride: 102 mmol/L (ref 96–106)
Creatinine, Ser: 0.83 mg/dL (ref 0.76–1.27)
Globulin, Total: 2.3 g/dL (ref 1.5–4.5)
Glucose: 86 mg/dL (ref 70–99)
Potassium: 4.4 mmol/L (ref 3.5–5.2)
Sodium: 136 mmol/L (ref 134–144)
Total Protein: 7.2 g/dL (ref 6.0–8.5)
eGFR: 113 mL/min/1.73 (ref >59)

## 2024-04-12 LAB — LIPID PANEL
Chol/HDL Ratio: 2.4 ratio (ref 0.0–5.0)
Cholesterol, Total: 124 mg/dL (ref 100–199)
HDL: 51 mg/dL (ref >39)
LDL Chol Calc (NIH): 59 mg/dL (ref 0–99)
Triglycerides: 67 mg/dL (ref 0–149)
VLDL Cholesterol Cal: 14 mg/dL (ref 5–40)

## 2024-04-12 LAB — TSH RFX ON ABNORMAL TO FREE T4: TSH: 1.76 u[IU]/mL (ref 0.450–4.500)

## 2024-04-12 LAB — HEMOGLOBIN A1C
Est. average glucose Bld gHb Est-mCnc: 94 mg/dL
Hgb A1c MFr Bld: 4.9 % (ref 4.8–5.6)

## 2024-04-20 ENCOUNTER — Institutional Professional Consult (permissible substitution): Admitting: Neurology

## 2024-04-21 ENCOUNTER — Ambulatory Visit (HOSPITAL_BASED_OUTPATIENT_CLINIC_OR_DEPARTMENT_OTHER): Admitting: Family Medicine

## 2024-04-27 ENCOUNTER — Ambulatory Visit (HOSPITAL_BASED_OUTPATIENT_CLINIC_OR_DEPARTMENT_OTHER): Payer: Self-pay | Admitting: Family Medicine

## 2024-04-28 ENCOUNTER — Telehealth (INDEPENDENT_AMBULATORY_CARE_PROVIDER_SITE_OTHER): Admitting: Family Medicine

## 2024-04-28 ENCOUNTER — Encounter (HOSPITAL_BASED_OUTPATIENT_CLINIC_OR_DEPARTMENT_OTHER): Payer: Self-pay | Admitting: Family Medicine

## 2024-04-28 DIAGNOSIS — Z0000 Encounter for general adult medical examination without abnormal findings: Secondary | ICD-10-CM

## 2024-04-28 DIAGNOSIS — F419 Anxiety disorder, unspecified: Secondary | ICD-10-CM | POA: Diagnosis not present

## 2024-04-28 NOTE — Progress Notes (Signed)
° °  Virtual Visit  I connected with  Kenneth Munoz  on 04/28/2024 by telehealth and verified that I am speaking with the correct person using two identifiers. Visit completed via video.  I discussed the limitations, risks, security and privacy concerns of performing an evaluation and management service by telephone, including the higher likelihood of inaccurate diagnosis and treatment, and the availability of in person appointments.  We also discussed the likely need of an additional face to face encounter for complete and high quality delivery of care.  I also discussed with the patient that there may be a patient responsible charge related to this service. The patient expressed understanding and wishes to proceed.  Provider location is in medical facility. Patient location is at their home, different from provider location. People involved in care of the patient during this telehealth encounter were myself, my nurse/medical assistant, and my front office/scheduling team member.  Review of Systems: No fevers, chills, night sweats, weight loss, chest pain, or shortness of breath.   Objective Findings:    General: Speaking full sentences, no audible heavy breathing.  Sounds alert and appropriately interactive.    Independent interpretation of tests performed by another provider:   None.  Brief History, Exam, Impression, and Recommendations:    Anxiety At last visit, we did switch patient to Lexapro .  He did reach out with request to consider from current Wellbutrin  as this is the regimen his wife has been using.  We did proceed with this.  He indicates that he continues with Wellbutrin , however he did stop taking Lexapro .  He does feel that symptoms at present are doing fairly well with Wellbutrin  alone.  Denies any concerns today. We discussed considerations, given current progress and symptom control, would be reasonable to continue with current medication regimen and we can continue  holding Lexapro . We can plan to follow-up for next physical or sooner as needed.  I discussed the above assessment and treatment plan with the patient. The patient was provided an opportunity to ask questions and all were answered. The patient agreed with the plan and demonstrated an understanding of the instructions.  The patient was advised to call back or seek an in-person evaluation if the symptoms worsen or if the condition fails to improve as anticipated.  I provided 12 minutes of face to face and non-face-to-face time during this encounter date, time was needed to gather information, review chart, records, communicate/coordinate with staff remotely, as well as complete documentation.   ___________________________________________ Nomar Broad de [place], MD, ABFM, CAQSM Primary Care and Sports Medicine [facility]

## 2024-04-28 NOTE — Assessment & Plan Note (Signed)
 At last visit, we did switch patient to Lexapro .  He did reach out with request to consider from current Wellbutrin  as this is the regimen his wife has been using.  We did proceed with this.  He indicates that he continues with Wellbutrin , however he did stop taking Lexapro .  He does feel that symptoms at present are doing fairly well with Wellbutrin  alone.  Denies any concerns today. We discussed considerations, given current progress and symptom control, would be reasonable to continue with current medication regimen and we can continue holding Lexapro . We can plan to follow-up for next physical or sooner as needed.

## 2024-05-17 ENCOUNTER — Institutional Professional Consult (permissible substitution): Admitting: Neurology

## 2024-06-07 ENCOUNTER — Telehealth: Payer: Self-pay | Admitting: Neurology

## 2024-06-07 ENCOUNTER — Institutional Professional Consult (permissible substitution): Admitting: Neurology

## 2024-06-07 ENCOUNTER — Encounter: Payer: Self-pay | Admitting: Neurology

## 2024-06-07 NOTE — Telephone Encounter (Signed)
 Patient cancelled appointment due to feeling sick. Transferred patient to Billing to pay no show fee.

## 2024-06-07 NOTE — Telephone Encounter (Signed)
 Pt has r/s his sleep consult

## 2024-06-24 ENCOUNTER — Other Ambulatory Visit (HOSPITAL_BASED_OUTPATIENT_CLINIC_OR_DEPARTMENT_OTHER): Payer: Self-pay | Admitting: *Deleted

## 2024-06-24 MED ORDER — BUPROPION HCL ER (XL) 150 MG PO TB24: 150.0000 mg | ORAL_TABLET | ORAL | 2 refills | Status: AC

## 2024-06-30 ENCOUNTER — Institutional Professional Consult (permissible substitution): Admitting: Neurology

## 2024-08-18 ENCOUNTER — Ambulatory Visit: Admitting: Allergy
